# Patient Record
Sex: Female | Born: 1945
Health system: Southern US, Community
[De-identification: ages and names within clinical notes are randomized; demographics above are authoritative.]

## PROBLEM LIST (undated history)

## (undated) DIAGNOSIS — I10 Essential (primary) hypertension: Secondary | ICD-10-CM

## (undated) DIAGNOSIS — K219 Gastro-esophageal reflux disease without esophagitis: Secondary | ICD-10-CM

## (undated) DIAGNOSIS — I499 Cardiac arrhythmia, unspecified: Secondary | ICD-10-CM

## (undated) DIAGNOSIS — M1990 Unspecified osteoarthritis, unspecified site: Secondary | ICD-10-CM

## (undated) HISTORY — PX: THYROIDECTOMY, PARTIAL: SHX18

## (undated) HISTORY — PX: TONSILLECTOMY: SUR1361

## (undated) HISTORY — PX: BREAST LUMPECTOMY: SHX2

---

## 2014-04-03 ENCOUNTER — Encounter: Payer: Self-pay | Admitting: Sports Medicine

## 2014-04-03 ENCOUNTER — Ambulatory Visit
Admission: RE | Admit: 2014-04-03 | Discharge: 2014-04-03 | Disposition: A | Discharge status: Home / Self Care | Payer: Medicare Other | Source: Ambulatory Visit | Attending: Sports Medicine | Admitting: Sports Medicine

## 2014-04-03 ENCOUNTER — Ambulatory Visit (INDEPENDENT_AMBULATORY_CARE_PROVIDER_SITE_OTHER): Payer: Medicare Other | Admitting: Sports Medicine

## 2014-04-03 VITALS — BP 193/76 | Ht 65.0 in | Wt 197.0 lb

## 2014-04-03 DIAGNOSIS — M25511 Pain in right shoulder: Secondary | ICD-10-CM

## 2014-04-03 DIAGNOSIS — M25519 Pain in unspecified shoulder: Secondary | ICD-10-CM

## 2014-04-03 DIAGNOSIS — S42209A Unspecified fracture of upper end of unspecified humerus, initial encounter for closed fracture: Secondary | ICD-10-CM

## 2014-04-03 NOTE — Progress Notes (Signed)
   Subjective:    Patient ID: Rhonda Bell, female    DOB: September 06, 1946, 68 y.o.   MRN: [id number]  HPI chief complaint: Right shoulder pain  Very pleasant 68 year old right-hand-dominant female comes in today complaining of 3-1/2 months of right shoulder pain. She suffered a fall back at the end of February landing on her right shoulder. She suffered a proximal humerus fracture and was treated by an orthopedist in [facility]. Initial treatment consisted of prolonged sling immobilization of 10 weeks followed by physical therapy. She has continued to have persistent pain and limited mobility. Her pain is all along the lateral shoulder. Some radiating pain into her forearm. Also some occasional shooting pain into her forearm and wrist. She denies problems with this shoulder in the past.  Medical history reviewed. She has a history of hypertension Medications reviewed Nonsmoker     Review of Systems     Objective:   Physical Exam Well-developed, well-nourished. No acute distress. Awake alert oriented x3. Vital signs are reviewed.  Right shoulder: There is tenderness to palpation along the lateral shoulder. No soft tissue swelling. No ecchymosis. Active forward flexion is to 80. Active abduction is to 60. Passive rotation shows active forward flexion to about 110. Active abduction only to 70. Internal rotation is 70. Passive external rotation is nearly equal bilaterally. There is global weakness. Patient demonstrates full neurological function distally. Good radial and ulnar pulses.  X-rays from an outside source of the right shoulder are reviewed. They demonstrate a slightly displaced surgical neck fracture of the proximal humerus. I repeat x-rays today of both the humerus and the shoulder. This appears to be a two-part surgical neck fracture with mild displacement but evidence of possible nonunion.  MSK ultrasound of the right shoulder was performed. Limited images were obtained. It was  difficult to fully visualize the rotator cuff. I think this was due in part to her fracture as well as patient discomfort during the exam.      Assessment & Plan:  1. 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 Dion SaucierLandau for further workup and treatment. In the meantime, patient will continue with simple range of motion exercises using pain as her guide. I'll defer further workup and treatment to the discretion of Dr. Dion SaucierLandau and the patient will followup with me when necessary.

## 2014-04-03 NOTE — Patient Instructions (Signed)
[facility] AT [address] [facility] [address] [place] [place] [id number] [phone]

## 2015-04-16 ENCOUNTER — Emergency Department (HOSPITAL_BASED_OUTPATIENT_CLINIC_OR_DEPARTMENT_OTHER)
Admission: EM | Admit: 2015-04-16 | Discharge: 2015-04-16 | Disposition: A | Discharge status: Home / Self Care | Payer: Medicare Other | Attending: Emergency Medicine | Admitting: Emergency Medicine

## 2015-04-16 ENCOUNTER — Encounter (HOSPITAL_BASED_OUTPATIENT_CLINIC_OR_DEPARTMENT_OTHER): Payer: Self-pay

## 2015-04-16 DIAGNOSIS — Z7982 Long term (current) use of aspirin: Secondary | ICD-10-CM | POA: Diagnosis not present

## 2015-04-16 DIAGNOSIS — S61207A Unspecified open wound of left little finger without damage to nail, initial encounter: Secondary | ICD-10-CM | POA: Diagnosis not present

## 2015-04-16 DIAGNOSIS — S61217A Laceration without foreign body of left little finger without damage to nail, initial encounter: Secondary | ICD-10-CM | POA: Diagnosis present

## 2015-04-16 DIAGNOSIS — Z23 Encounter for immunization: Secondary | ICD-10-CM | POA: Insufficient documentation

## 2015-04-16 DIAGNOSIS — M1990 Unspecified osteoarthritis, unspecified site: Secondary | ICD-10-CM | POA: Insufficient documentation

## 2015-04-16 DIAGNOSIS — I10 Essential (primary) hypertension: Secondary | ICD-10-CM | POA: Insufficient documentation

## 2015-04-16 DIAGNOSIS — W274XXA Contact with kitchen utensil, initial encounter: Secondary | ICD-10-CM | POA: Insufficient documentation

## 2015-04-16 DIAGNOSIS — Y93G9 Activity, other involving cooking and grilling: Secondary | ICD-10-CM | POA: Diagnosis not present

## 2015-04-16 DIAGNOSIS — T148XXA Other injury of unspecified body region, initial encounter: Secondary | ICD-10-CM

## 2015-04-16 DIAGNOSIS — Y9289 Other specified places as the place of occurrence of the external cause: Secondary | ICD-10-CM | POA: Diagnosis not present

## 2015-04-16 DIAGNOSIS — Y998 Other external cause status: Secondary | ICD-10-CM | POA: Diagnosis not present

## 2015-04-16 DIAGNOSIS — Z79899 Other long term (current) drug therapy: Secondary | ICD-10-CM | POA: Diagnosis not present

## 2015-04-16 HISTORY — DX: Unspecified osteoarthritis, unspecified site: M19.90

## 2015-04-16 HISTORY — DX: Essential (primary) hypertension: I10

## 2015-04-16 MED ORDER — TETANUS-DIPHTH-ACELL PERTUSSIS 5-2.5-18.5 LF-MCG/0.5 IM SUSP
0.5000 mL | Freq: Once | INTRAMUSCULAR | Status: AC
Start: 2015-04-16 — End: 2015-04-16
  Administered 2015-04-16: 0.5 mL via INTRAMUSCULAR
  Filled 2015-04-16: qty 0.5

## 2015-04-16 MED ORDER — "THROMBI-PAD 3""X3"" EX PADS"
1.0000 | MEDICATED_PAD | Freq: Once | CUTANEOUS | Status: AC
  Administered 2015-04-16: 1 via TOPICAL

## 2015-04-16 MED ORDER — GELATIN ABSORBABLE 12-7 MM EX MISC
1.0000 | Freq: Once | CUTANEOUS | Status: AC
  Administered 2015-04-16: 1 via TOPICAL

## 2015-04-16 MED ORDER — GELATIN ABSORBABLE 12-7 MM EX MISC
CUTANEOUS | Status: AC
  Administered 2015-04-16: 1 via TOPICAL
  Filled 2015-04-16: qty 1

## 2015-04-16 MED ORDER — "THROMBI-PAD 3""X3"" EX PADS"
MEDICATED_PAD | CUTANEOUS | Status: AC
  Administered 2015-04-16: 1 via TOPICAL
  Filled 2015-04-16: qty 1

## 2015-04-16 NOTE — ED Provider Notes (Signed)
CSN: [id number]     Arrival date & time 04/16/15  1837 History   First MD Initiated Contact with Patient 04/16/15 1848     Chief Complaint  Patient presents with  . Laceration     (Consider location/radiation/quality/duration/timing/severity/associated sxs/prior Treatment) HPI Comments: Pt cut right fifth and fourth digit with a mandolin. Unable to control bleeding on the fifth digit  Patient is a 69 y.o. female presenting with skin laceration. The history is provided by the patient. No language interpreter was used.  Laceration Location:  Finger Finger laceration location:  L little finger Quality: avulsion   Bleeding: uncontrolled     Past Medical History  Diagnosis Date  . Hypertension   . Arthritis    History reviewed. No pertinent past surgical history. No family history on file. History  Substance Use Topics  . Smoking status: Never Smoker   . Smokeless tobacco: Not on file  . Alcohol Use: Not on file   OB History    No data available     Review of Systems  All other systems reviewed and are negative.     Allergies  Penicillins and Sulfa antibiotics  Home Medications   Prior to Admission medications   Medication Sig Start Date End Date Taking? Authorizing Provider  aspirin EC 81 MG tablet Take 81 mg by mouth daily.   Yes Historical Provider, MD  atenolol (TENORMIN) 50 MG tablet Take 50 mg by mouth daily.   Yes Historical Provider, MD  cetirizine (ZYRTEC) 10 MG tablet Take 10 mg by mouth daily.   Yes Historical Provider, MD  hydrochlorothiazide (MICROZIDE) 12.5 MG capsule Take 12.5 mg by mouth daily.   Yes Historical Provider, MD  losartan (COZAAR) 100 MG tablet Take 100 mg by mouth daily.   Yes Historical Provider, MD  omeprazole (PRILOSEC) 20 MG capsule Take 20 mg by mouth daily.   Yes Historical Provider, MD   BP 189/73 mmHg  Pulse 55  Temp(Src) 98.3 F (36.8 C) (Oral)  Resp 18  SpO2 100% Physical Exam  Constitutional: She is oriented to person,  place, and time. She appears well-developed and well-nourished.  Cardiovascular: Normal rate and regular rhythm.   Pulmonary/Chest: Effort normal and breath sounds normal.  Neurological: She is alert and oriented to person, place, and time.  Skin:  Skin avulsion noted to the right fifth digit. Bleeding. Small area noted to the 4th digit. Not bleeding. Skin to the area  Nursing note and vitals reviewed.   ED Course  Procedures (including critical care time) Labs Review Labs Reviewed - No data to display  Imaging Review No results found.   EKG Interpretation None      MDM   Final diagnoses:  Skin avulsion    Gel foam and thrombi pad used to the area and is okay to go home. Discussed return precautions    Teressa LowerVrinda Kyre Jeffries, NP 04/16/15 1947  Blake DivineJohn Wofford, MD 04/16/15 2328

## 2015-04-16 NOTE — Discharge Instructions (Signed)
Follow up as needed for any sign of infection Laceration Care, Adult A laceration is a cut or lesion that goes through all layers of the skin and into the tissue just beneath the skin. TREATMENT  Some lacerations may not require closure. Some lacerations may not be able to be closed due to an increased risk of infection. It is important to see your caregiver as soon as possible after an injury to minimize the risk of infection and maximize the opportunity for successful closure. If closure is appropriate, pain medicines may be given, if needed. The wound will be cleaned to help prevent infection. Your caregiver will use stitches (sutures), staples, wound glue (adhesive), or skin adhesive strips to repair the laceration. These tools bring the skin edges together to allow for faster healing and a better cosmetic outcome. However, all wounds will heal with a scar. Once the wound has healed, scarring can be minimized by covering the wound with sunscreen during the day for 1 full year. HOME CARE INSTRUCTIONS  For sutures or staples:  Keep the wound clean and dry.  If you were given a bandage (dressing), you should change it at least once a day. Also, change the dressing if it becomes wet or dirty, or as directed by your caregiver.  Wash the wound with soap and water 2 times a day. Rinse the wound off with water to remove all soap. Pat the wound dry with a clean towel.  After cleaning, apply a thin layer of the antibiotic ointment as recommended by your caregiver. This will help prevent infection and keep the dressing from sticking.  You may shower as usual after the first 24 hours. Do not soak the wound in water until the sutures are removed.  Only take over-the-counter or prescription medicines for pain, discomfort, or fever as directed by your caregiver.  Get your sutures or staples removed as directed by your caregiver. For skin adhesive strips:  Keep the wound clean and dry.  Do not get the  skin adhesive strips wet. You may bathe carefully, using caution to keep the wound dry.  If the wound gets wet, pat it dry with a clean towel.  Skin adhesive strips will fall off on their own. You may trim the strips as the wound heals. Do not remove skin adhesive strips that are still stuck to the wound. They will fall off in time. For wound adhesive:  You may briefly wet your wound in the shower or bath. Do not soak or scrub the wound. Do not swim. Avoid periods of heavy perspiration until the skin adhesive has fallen off on its own. After showering or bathing, gently pat the wound dry with a clean towel.  Do not apply liquid medicine, cream medicine, or ointment medicine to your wound while the skin adhesive is in place. This may loosen the film before your wound is healed.  If a dressing is placed over the wound, be careful not to apply tape directly over the skin adhesive. This may cause the adhesive to be pulled off before the wound is healed.  Avoid prolonged exposure to sunlight or tanning lamps while the skin adhesive is in place. Exposure to ultraviolet light in the first year will darken the scar.  The skin adhesive will usually remain in place for 5 to 10 days, then naturally fall off the skin. Do not pick at the adhesive film. You may need a tetanus shot if:  You cannot remember when you had your last  tetanus shot.  You have never had a tetanus shot. If you get a tetanus shot, your arm may swell, get red, and feel warm to the touch. This is common and not a problem. If you need a tetanus shot and you choose not to have one, there is a rare chance of getting tetanus. Sickness from tetanus can be serious. SEEK MEDICAL CARE IF:   You have redness, swelling, or increasing pain in the wound.  You see a red line that goes away from the wound.  You have yellowish-white fluid (pus) coming from the wound.  You have a fever.  You notice a bad smell coming from the wound or  dressing.  Your wound breaks open before or after sutures have been removed.  You notice something coming out of the wound such as wood or glass.  Your wound is on your hand or foot and you cannot move a finger or toe. SEEK IMMEDIATE MEDICAL CARE IF:   Your pain is not controlled with prescribed medicine.  You have severe swelling around the wound causing pain and numbness or a change in color in your arm, hand, leg, or foot.  Your wound splits open and starts bleeding.  You have worsening numbness, weakness, or loss of function of any joint around or beyond the wound.  You develop painful lumps near the wound or on the skin anywhere on your body. MAKE SURE YOU:   Understand these instructions.  Will watch your condition.  Will get help right away if you are not doing well or get worse. Document Released: 10/05/2005 Document Revised: 12/28/2011 Document Reviewed: 03/31/2011 [facility] Patient Information 2015 [place], [place]. This information is not intended to replace advice given to you by your health care provider. Make sure you discuss any questions you have with your health care provider.

## 2015-04-16 NOTE — ED Notes (Signed)
Patient here with laceration to right hand 4th and 5th digits, cut same with slicer this pm, pressure dressing applied

## 2016-11-17 ENCOUNTER — Ambulatory Visit (INDEPENDENT_AMBULATORY_CARE_PROVIDER_SITE_OTHER): Payer: PPO | Admitting: Sports Medicine

## 2016-11-17 DIAGNOSIS — M25562 Pain in left knee: Secondary | ICD-10-CM | POA: Diagnosis not present

## 2016-11-17 NOTE — Assessment & Plan Note (Signed)
More consistent with meniscal tear given pain with pivoting and descending stairs. No obvious effusion or tear and quick bedside US today. Discussed treatment options with patient. Given that it is improving, will continue with conservative management. Placed patient in compression sleeve and discussed isometric quad strengthening exercises. Follow up in 1 month. If not improving, would consider steroid injection at that time.

## 2016-11-17 NOTE — Progress Notes (Signed)
    Subjective:  Rhonda Bell is a [age] y.o. female who [facility] with a chief complaint of left knee pain.   HPI:  Left Knee Pain Symptoms started about 2 months ago when she was moving furniture around a warehouse. She noticed pain and swelling that night after moving furniture all day. She went back the next day and did more moving and the pain worsened, so she stopped. She has not done have strenuous exercises since then. Overall, her pain has significantly improved over the past several weeks. She has not noticed any swelling since the initial injury. Pain is worse with squatting, going down inclines, and pivoting. No mechanical symptoms. She is on meloxicam chronically. She tried taking some tylenol which helped with her pain.   ROS: Per HPI  Objective:  Physical Exam: BP (!) 143/76   Ht 5\' 5"  (1.651 m)   Wt 185 lb (83.9 kg)   BMI 30.79 kg/m   Gen: NAD, resting comfortably MSK: - L Knee: No deformities. Tender to palpation over medial joint line. Moderate crepitus with active ROM. FROM. Strength 55/ in all directions. Stable to varus and valgus stress. Anterior and posterior drawer negative. Lachman negative. Pain with thessaly's.  Bedside US L Knee: No effusion in suprapatellar pouch. Medial meniscus visualized without any obvious tears.   Assessment/Plan:  Left knee pain More consistent with meniscal tear given pain with pivoting and descending stairs. No obvious effusion or tear and quick bedside US today. Discussed treatment options with patient. Given that it is improving, will continue with conservative management. Placed patient in compression sleeve and discussed isometric quad strengthening exercises. Follow up in 1 month. If not improving, would consider steroid injection at that time.    Katina Degreealeb M. Jimmey RalphParker, MD [facility] Resident PGY-3 11/17/2016 11:42 AM   Patient seen and evaluated with the resident. I agree with the above plan of care.  Patient's history and physical exam suggest a degenerative meniscal tear. Since she is already improving we will try a short course of compression and isometric quad strengthening exercises. [place] for [place] to play in a pickle ball determine in March so I'll have her follow-up with me again at the end of February for reevaluation. We did discuss the merits of a cortisone injection if her symptoms do not continue to improve. She will call with questions or concerns prior to her follow-up visit.

## 2016-12-15 ENCOUNTER — Ambulatory Visit (INDEPENDENT_AMBULATORY_CARE_PROVIDER_SITE_OTHER): Payer: PPO | Admitting: Sports Medicine

## 2016-12-15 ENCOUNTER — Encounter: Payer: Self-pay | Admitting: Sports Medicine

## 2016-12-15 VITALS — BP 157/90 | Ht 65.0 in | Wt 185.0 lb

## 2016-12-15 DIAGNOSIS — G5691 Unspecified mononeuropathy of right upper limb: Secondary | ICD-10-CM

## 2016-12-15 DIAGNOSIS — M25562 Pain in left knee: Secondary | ICD-10-CM

## 2016-12-16 NOTE — Progress Notes (Signed)
   Subjective:    Patient ID: Rhonda CooleyJeanne Brass, female    DOB: 1946/01/22, 71 y.o.   MRN: [id number]  HPI   Patient comes in today for follow-up on left knee pain. Overall her symptoms have improved. She still has some mild discomfort and some mild stiffness but it is better than a month ago. She will be leaving soon for a trip to [place]. She is also complaining of 2 weeks of intermittent right shoulder pain. Pain is primarily around the scapula but she will get radiating pain into the axilla as well as down the ulnar aspect of her right arm. Her symptoms are worse with driving or with elevating her shoulders. She has not noticed any weakness. She does note that if she sits with her arms directly at her side then her symptoms do improve.    Review of Systems    as above Objective:   Physical Exam  Well-developed, well-nourished. No acute distress. Vital signs reviewed  Left knee: Full range of motion. No obvious effusion. 2+ patellofemoral crepitus. Mild tenderness to palpation along the medial joint line. Negative McMurray's. Good joint stability. Neurovascularly intact distally.  Left shoulder: Full painless range of motion. Good cuff strength.  Neurological exam: Positive Spurling's to the right. Strength is 5/5 in both upper extremities. Reflexes are equal at the biceps, triceps, and brachial radialis tendons. Sensation is intact to light touch grossly. No obvious atrophy.      Assessment & Plan:   Improving left knee pain likely secondary to degenerative meniscal tear Right shoulder and arm pain likely secondary to cervical degenerative disc disease  Since the patient's left knee pain has improved we are going to hold on further workup or treatment for the time being. She will be going to [place] soon and will be participating in pickle ball. That will be a good test for her knee. If her symptoms worsen then she will return to the office for reconsideration of a cortisone  injection. In regards to her neuropathic right arm pain, it sounds like she has a modified traction device at home for her cervical spine. She does think that this helps with her symptoms so I've encouraged her to continue using it. She will try to avoid positions such as driving with her arm up on the steering wheel or sitting with her arms elevated on a high desk which would close down the cervical spine foramen on the right side. If her symptoms become more frequent or more intolerable then we will need to get some imaging of her cervical spine. Follow-up with me as needed.

## 2017-06-08 DIAGNOSIS — R928 Other abnormal and inconclusive findings on diagnostic imaging of breast: Secondary | ICD-10-CM | POA: Diagnosis not present

## 2017-06-08 DIAGNOSIS — N6489 Other specified disorders of breast: Secondary | ICD-10-CM | POA: Diagnosis not present

## 2017-06-08 DIAGNOSIS — N6032 Fibrosclerosis of left breast: Secondary | ICD-10-CM | POA: Diagnosis not present

## 2017-06-08 DIAGNOSIS — N6320 Unspecified lump in the left breast, unspecified quadrant: Secondary | ICD-10-CM | POA: Diagnosis not present

## 2017-06-25 DIAGNOSIS — I10 Essential (primary) hypertension: Secondary | ICD-10-CM | POA: Diagnosis not present

## 2017-06-25 DIAGNOSIS — R739 Hyperglycemia, unspecified: Secondary | ICD-10-CM | POA: Diagnosis not present

## 2017-06-28 DIAGNOSIS — Z0000 Encounter for general adult medical examination without abnormal findings: Secondary | ICD-10-CM | POA: Diagnosis not present

## 2017-06-28 DIAGNOSIS — Z23 Encounter for immunization: Secondary | ICD-10-CM | POA: Diagnosis not present

## 2017-06-28 DIAGNOSIS — I10 Essential (primary) hypertension: Secondary | ICD-10-CM | POA: Diagnosis not present

## 2017-06-28 DIAGNOSIS — E782 Mixed hyperlipidemia: Secondary | ICD-10-CM | POA: Diagnosis not present

## 2017-06-28 DIAGNOSIS — K219 Gastro-esophageal reflux disease without esophagitis: Secondary | ICD-10-CM | POA: Diagnosis not present

## 2017-06-28 DIAGNOSIS — N200 Calculus of kidney: Secondary | ICD-10-CM | POA: Diagnosis not present

## 2017-07-02 DIAGNOSIS — Z1211 Encounter for screening for malignant neoplasm of colon: Secondary | ICD-10-CM | POA: Diagnosis not present

## 2017-07-15 DIAGNOSIS — Z1211 Encounter for screening for malignant neoplasm of colon: Secondary | ICD-10-CM | POA: Diagnosis not present

## 2017-07-15 DIAGNOSIS — R195 Other fecal abnormalities: Secondary | ICD-10-CM | POA: Diagnosis not present

## 2018-02-08 ENCOUNTER — Ambulatory Visit
Admission: RE | Admit: 2018-02-08 | Discharge: 2018-02-08 | Disposition: A | Discharge status: Home / Self Care | Payer: PPO | Source: Ambulatory Visit | Attending: Sports Medicine | Admitting: Sports Medicine

## 2018-02-08 ENCOUNTER — Ambulatory Visit: Payer: PPO | Admitting: Sports Medicine

## 2018-02-08 VITALS — BP 124/72 | Ht 65.0 in | Wt 192.0 lb

## 2018-02-08 DIAGNOSIS — G8929 Other chronic pain: Secondary | ICD-10-CM | POA: Diagnosis not present

## 2018-02-08 DIAGNOSIS — M1712 Unilateral primary osteoarthritis, left knee: Secondary | ICD-10-CM | POA: Diagnosis not present

## 2018-02-08 DIAGNOSIS — M25562 Pain in left knee: Principal | ICD-10-CM

## 2018-02-09 NOTE — Progress Notes (Signed)
   Subjective:    Patient ID: Rhonda Bell, female    DOB: 18-Apr-1946, 72 y.o.   MRN: [id number]  HPI chief complaint: Left knee pain  Patient comes in today with persistent left knee pain. She was seen in the office a little over a year ago. She was given a compression sleeve and some home exercises and her symptoms did improve a little but never completely resolved. She endorses a sharp and catching pain along the medial knee which happens intermittently. She has noticed some slight swelling. She does get some feelings of instability. No recent trauma. No numbness or tingling. For the most part, her symptoms are tolerable.   Review of Systems As above    Objective:   Physical Exam  Well-developed, well-nourished. No acute distress. Awake alert and oriented 3. Vital signs reviewed  Left knee: Full range of motion.trace effusion. She is tender to palpation along the medial joint line but has a negative Thessaly's. Knee is grossly stable to ligamentous exam. 1+ patellofemoral crepitus. Neurovascularly intact distally.  X-rays of the left knee show a moderate amount of medial joint space narrowing consistent with medial compartmental DJD. Nothing acute is seen.      Assessment & Plan:   Left knee pain secondary to medial compartmental DJD versus degenerative meniscal tear  Patient's symptoms are not bothering her enough at this point in time to pursue further treatment. She takes meloxicam daily which is helpful. If her symptoms do worsen I would recommend starting with a cortisone injection before considering further diagnostic imaging. Patient was re-educated in isometric quad exercises and will follow-up with me as needed.

## 2018-03-30 DIAGNOSIS — I10 Essential (primary) hypertension: Secondary | ICD-10-CM | POA: Diagnosis not present

## 2018-03-30 DIAGNOSIS — R221 Localized swelling, mass and lump, neck: Secondary | ICD-10-CM | POA: Diagnosis not present

## 2018-03-30 DIAGNOSIS — E782 Mixed hyperlipidemia: Secondary | ICD-10-CM | POA: Diagnosis not present

## 2018-04-06 DIAGNOSIS — R221 Localized swelling, mass and lump, neck: Secondary | ICD-10-CM | POA: Diagnosis not present

## 2018-05-16 ENCOUNTER — Telehealth: Payer: Self-pay | Admitting: Sports Medicine

## 2018-05-16 NOTE — Telephone Encounter (Signed)
Patient wants to know if there is anything else that can be done for her L. knee at this point.  It is still giving her problems.  What are her options now?  She wants to know if surgery (other than knee replacement) would help? Maybe an Exogen disc?

## 2018-06-03 ENCOUNTER — Encounter

## 2018-06-03 ENCOUNTER — Encounter: Payer: Self-pay | Admitting: Sports Medicine

## 2018-06-03 ENCOUNTER — Ambulatory Visit: Payer: PPO | Admitting: Sports Medicine

## 2018-06-03 VITALS — BP 142/88 | Ht 65.0 in | Wt 194.0 lb

## 2018-06-03 DIAGNOSIS — M1712 Unilateral primary osteoarthritis, left knee: Secondary | ICD-10-CM | POA: Diagnosis not present

## 2018-06-03 MED ORDER — METHYLPREDNISOLONE ACETATE 40 MG/ML IJ SUSP
40.0000 mg | Freq: Once | INTRAMUSCULAR | Status: AC
  Administered 2018-06-03: 40 mg via INTRA_ARTICULAR

## 2018-06-03 NOTE — Progress Notes (Signed)
   Subjective:    Patient ID: Rhonda Bell, female    DOB: July 24, 1946, 72 y.o.   MRN: [id number]  HPI   Patient comes in today with persistent left knee pain and swelling. Previous x-rays showed moderate medial compartmental DJD. Her pain is most noticeable with walking down a steep hill. Mild pain when coming down stairs. No locking. She wears her compression sleeve when active. She is interested in trying a cortisone injection.   Review of Systems    as above Objective:   Physical Exam  Well-developed, well-nourished. No acute distress. Awake alert and oriented 3. Vital signs reviewed  Left knee: Range of motion is 0-130. Trace effusion. She is tender to palpation along the medial joint line. No tenderness along the lateral joint line. Negative McMurray's. Good ligament stability. Neurovascularly intact distally. Walking without significant limp.  Previous x-rays show moderate medial compartment DJD      Assessment & Plan:   Persistent left knee pain secondary to DJD versus meniscal tear  Patient's left knee is injected today with cortisone. An anterior lateral approach was utilized. Patient tolerated this without difficulty. She may resume activity as tolerated. If symptoms persist, she will call the office and we will consider further diagnostic imaging to rule out a meniscal tear. Follow-up for ongoing or recalcitrant issues.  Consent obtained and verified. Time-out conducted. Noted no overlying erythema, induration, or other signs of local infection. Skin prepped in a sterile fashion. Topical analgesic spray: Ethyl chloride. Joint: left knee Needle: 25g 1.5 inch Completed without difficulty. Meds: 3cc 1% xylocaine, 1cc (40mg ) depomedrol  Advised to call if fevers/chills, erythema, induration, drainage, or persistent bleeding.

## 2018-06-14 DIAGNOSIS — Z09 Encounter for follow-up examination after completed treatment for conditions other than malignant neoplasm: Secondary | ICD-10-CM | POA: Diagnosis not present

## 2018-06-14 DIAGNOSIS — R928 Other abnormal and inconclusive findings on diagnostic imaging of breast: Secondary | ICD-10-CM | POA: Diagnosis not present

## 2018-06-14 DIAGNOSIS — N6322 Unspecified lump in the left breast, upper inner quadrant: Secondary | ICD-10-CM | POA: Diagnosis not present

## 2018-07-01 DIAGNOSIS — I10 Essential (primary) hypertension: Secondary | ICD-10-CM | POA: Diagnosis not present

## 2018-07-01 DIAGNOSIS — R739 Hyperglycemia, unspecified: Secondary | ICD-10-CM | POA: Diagnosis not present

## 2018-07-04 DIAGNOSIS — Z0000 Encounter for general adult medical examination without abnormal findings: Secondary | ICD-10-CM | POA: Diagnosis not present

## 2018-07-04 DIAGNOSIS — K219 Gastro-esophageal reflux disease without esophagitis: Secondary | ICD-10-CM | POA: Diagnosis not present

## 2018-07-04 DIAGNOSIS — R739 Hyperglycemia, unspecified: Secondary | ICD-10-CM | POA: Diagnosis not present

## 2018-07-04 DIAGNOSIS — G43909 Migraine, unspecified, not intractable, without status migrainosus: Secondary | ICD-10-CM | POA: Diagnosis not present

## 2018-07-04 DIAGNOSIS — I10 Essential (primary) hypertension: Secondary | ICD-10-CM | POA: Diagnosis not present

## 2018-07-04 DIAGNOSIS — M159 Polyosteoarthritis, unspecified: Secondary | ICD-10-CM | POA: Diagnosis not present

## 2018-07-04 DIAGNOSIS — E782 Mixed hyperlipidemia: Secondary | ICD-10-CM | POA: Diagnosis not present

## 2018-07-15 DIAGNOSIS — Z23 Encounter for immunization: Secondary | ICD-10-CM | POA: Diagnosis not present

## 2018-07-26 DIAGNOSIS — R350 Frequency of micturition: Secondary | ICD-10-CM | POA: Diagnosis not present

## 2018-07-26 DIAGNOSIS — N3000 Acute cystitis without hematuria: Secondary | ICD-10-CM | POA: Diagnosis not present

## 2018-11-15 ENCOUNTER — Ambulatory Visit (INDEPENDENT_AMBULATORY_CARE_PROVIDER_SITE_OTHER): Payer: PPO | Admitting: Sports Medicine

## 2018-11-15 VITALS — BP 118/68 | Ht 65.0 in | Wt 190.0 lb

## 2018-11-15 DIAGNOSIS — M1712 Unilateral primary osteoarthritis, left knee: Secondary | ICD-10-CM | POA: Diagnosis not present

## 2018-11-15 MED ORDER — METHYLPREDNISOLONE ACETATE 40 MG/ML IJ SUSP
40.0000 mg | Freq: Once | INTRAMUSCULAR | Status: AC
  Administered 2018-11-15: 40 mg via INTRA_ARTICULAR

## 2018-11-16 NOTE — Progress Notes (Signed)
   Subjective:    Patient ID: Rhonda Bell, female    DOB: Sep 16, 1946, 73 y.o.   MRN: [id number]  HPI chief complaint: Left knee pain  Patient comes in today requesting repeat cortisone injection for her left knee.  Previous x-rays showed a moderate medial compartmental DJD.  Cortisone injection administered in August was very helpful.  Pain has started to return and she has a couple of trips planned.  Pain is identical in nature to what she has experienced previously.  She denies any trauma.  No swelling.  No locking, catching, or popping.  She does endorse some stiffness.  Pain is diffuse throughout the knee and she describes it is burning in quality. It is especially noticeable with going up and down stairs.  Interim medical history reviewed Medications reviewed Allergies reviewed   Review of Systems    As above Objective:   Physical Exam  Well-developed, well-nourished.  No acute distress.  Awake alert and oriented x3.  Vital signs reviewed.  Left knee: Full range of motion.  No obvious effusion.  She is tender to palpation along the lateral joint line but no tenderness along the medial joint line today.  Pain but no popping with McMurray's.  Knee is stable to ligamentous exam.  Neurovascularly intact distally.      Assessment & Plan:   Returning left knee pain secondary to DJD versus meniscal tear  Left knee is reinjected today with cortisone.  An anterior lateral approach was utilized.  Patient tolerates this without difficulty.  She will start isometric quad exercises daily.  If symptoms persist then consider merits of further diagnostic imaging.  Follow-up for ongoing or recalcitrant issues.  Consent obtained and verified. Time-out conducted. Noted no overlying erythema, induration, or other signs of local infection. Skin prepped in a sterile fashion. Topical analgesic spray: Ethyl chloride. Joint: left knee Needle: 25g 1.5 inch Completed without difficulty. Meds: 3cc  1% xylocaine, 1cc (40mg ) depomedrol  Advised to call if fevers/chills, erythema, induration, drainage, or persistent bleeding.

## 2019-03-16 DIAGNOSIS — R928 Other abnormal and inconclusive findings on diagnostic imaging of breast: Secondary | ICD-10-CM | POA: Diagnosis not present

## 2019-03-16 DIAGNOSIS — N6321 Unspecified lump in the left breast, upper outer quadrant: Secondary | ICD-10-CM | POA: Diagnosis not present

## 2019-03-16 DIAGNOSIS — Z780 Asymptomatic menopausal state: Secondary | ICD-10-CM | POA: Diagnosis not present

## 2019-03-16 DIAGNOSIS — M810 Age-related osteoporosis without current pathological fracture: Secondary | ICD-10-CM | POA: Diagnosis not present

## 2019-07-04 DIAGNOSIS — Z0000 Encounter for general adult medical examination without abnormal findings: Secondary | ICD-10-CM | POA: Diagnosis not present

## 2019-07-04 DIAGNOSIS — E782 Mixed hyperlipidemia: Secondary | ICD-10-CM | POA: Diagnosis not present

## 2019-07-04 DIAGNOSIS — K219 Gastro-esophageal reflux disease without esophagitis: Secondary | ICD-10-CM | POA: Diagnosis not present

## 2019-07-04 DIAGNOSIS — R739 Hyperglycemia, unspecified: Secondary | ICD-10-CM | POA: Diagnosis not present

## 2019-07-04 DIAGNOSIS — Z13220 Encounter for screening for lipoid disorders: Secondary | ICD-10-CM | POA: Diagnosis not present

## 2019-07-04 DIAGNOSIS — I10 Essential (primary) hypertension: Secondary | ICD-10-CM | POA: Diagnosis not present

## 2019-07-06 DIAGNOSIS — K219 Gastro-esophageal reflux disease without esophagitis: Secondary | ICD-10-CM | POA: Diagnosis not present

## 2019-07-06 DIAGNOSIS — E782 Mixed hyperlipidemia: Secondary | ICD-10-CM | POA: Diagnosis not present

## 2019-07-06 DIAGNOSIS — Z0000 Encounter for general adult medical examination without abnormal findings: Secondary | ICD-10-CM | POA: Diagnosis not present

## 2019-07-06 DIAGNOSIS — R739 Hyperglycemia, unspecified: Secondary | ICD-10-CM | POA: Diagnosis not present

## 2019-07-06 DIAGNOSIS — Z23 Encounter for immunization: Secondary | ICD-10-CM | POA: Diagnosis not present

## 2019-07-06 DIAGNOSIS — I10 Essential (primary) hypertension: Secondary | ICD-10-CM | POA: Diagnosis not present

## 2019-08-18 DIAGNOSIS — H43813 Vitreous degeneration, bilateral: Secondary | ICD-10-CM | POA: Diagnosis not present

## 2019-08-18 DIAGNOSIS — H2513 Age-related nuclear cataract, bilateral: Secondary | ICD-10-CM | POA: Diagnosis not present

## 2020-01-13 IMAGING — DX DG KNEE COMPLETE 4+V*L*
4 series · 4 of 4 positions shown · non-contrast
Comparison: None

CLINICAL DATA: LEFT knee worsening pain and swelling, injury 1 year
ago, history of arthritis and hypertension

EXAM:
LEFT KNEE - COMPLETE 4+ VIEW

[dg knee complete 4 views left (1 of 4)]
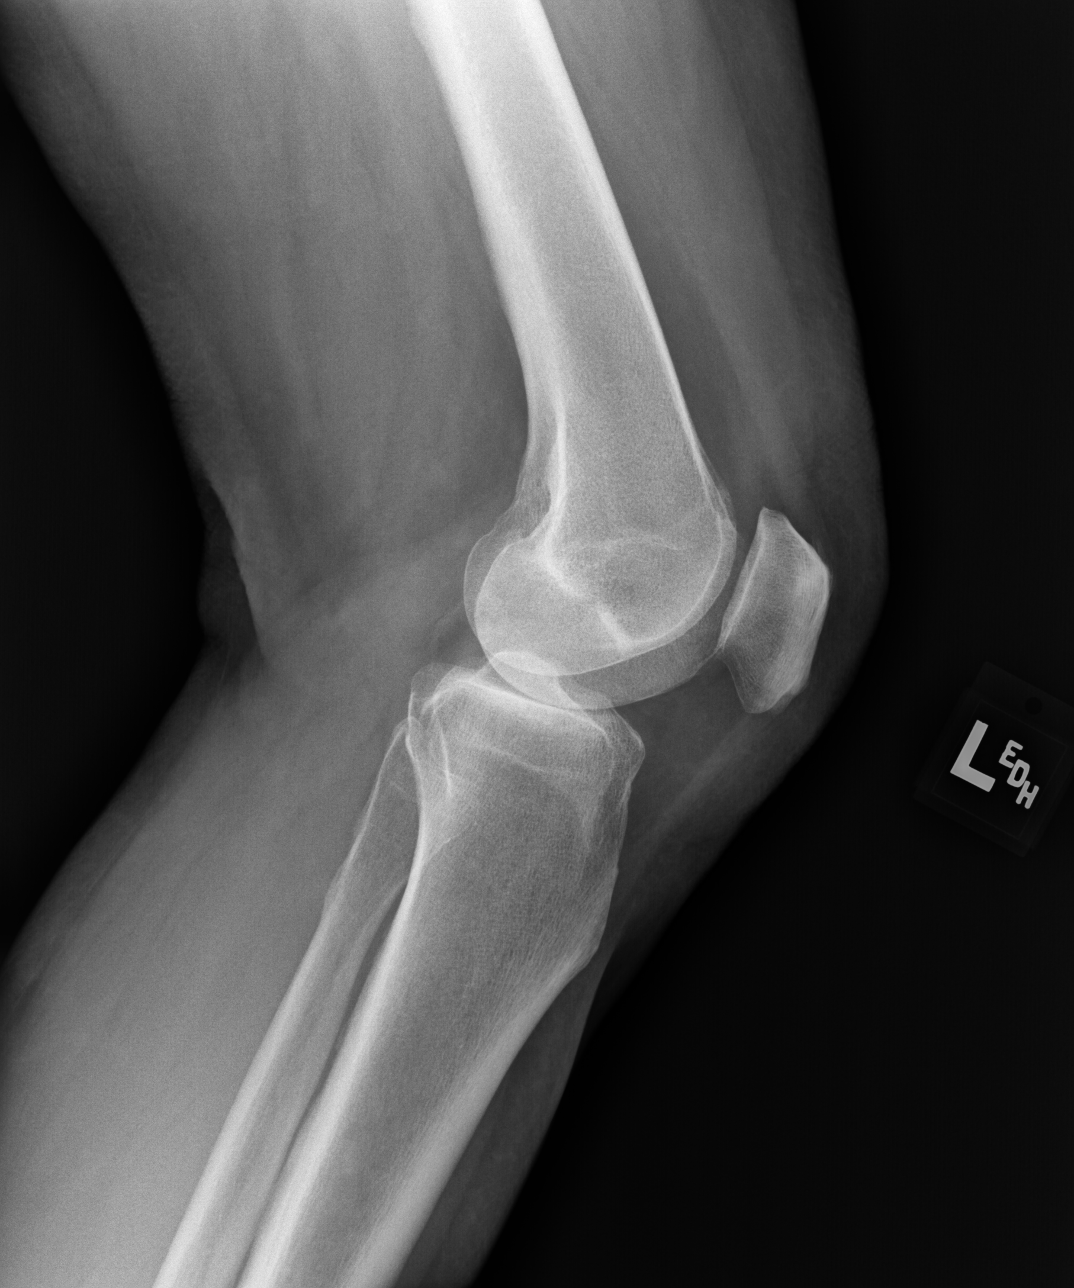

[dg knee complete 4 views left (2 of 4)]
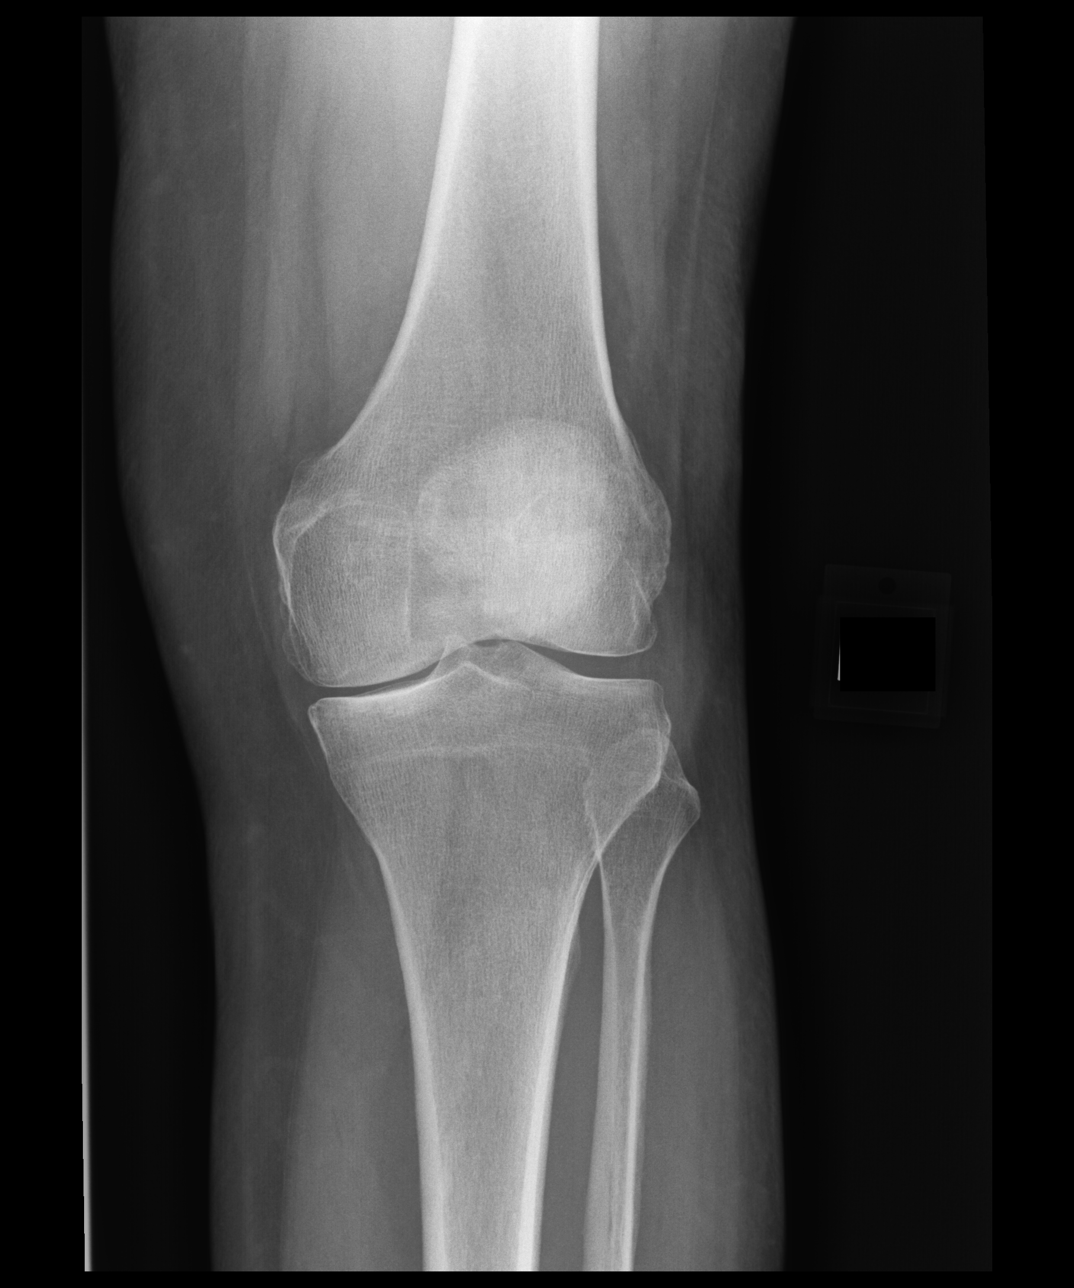

[dg knee complete 4 views left (3 of 4)]
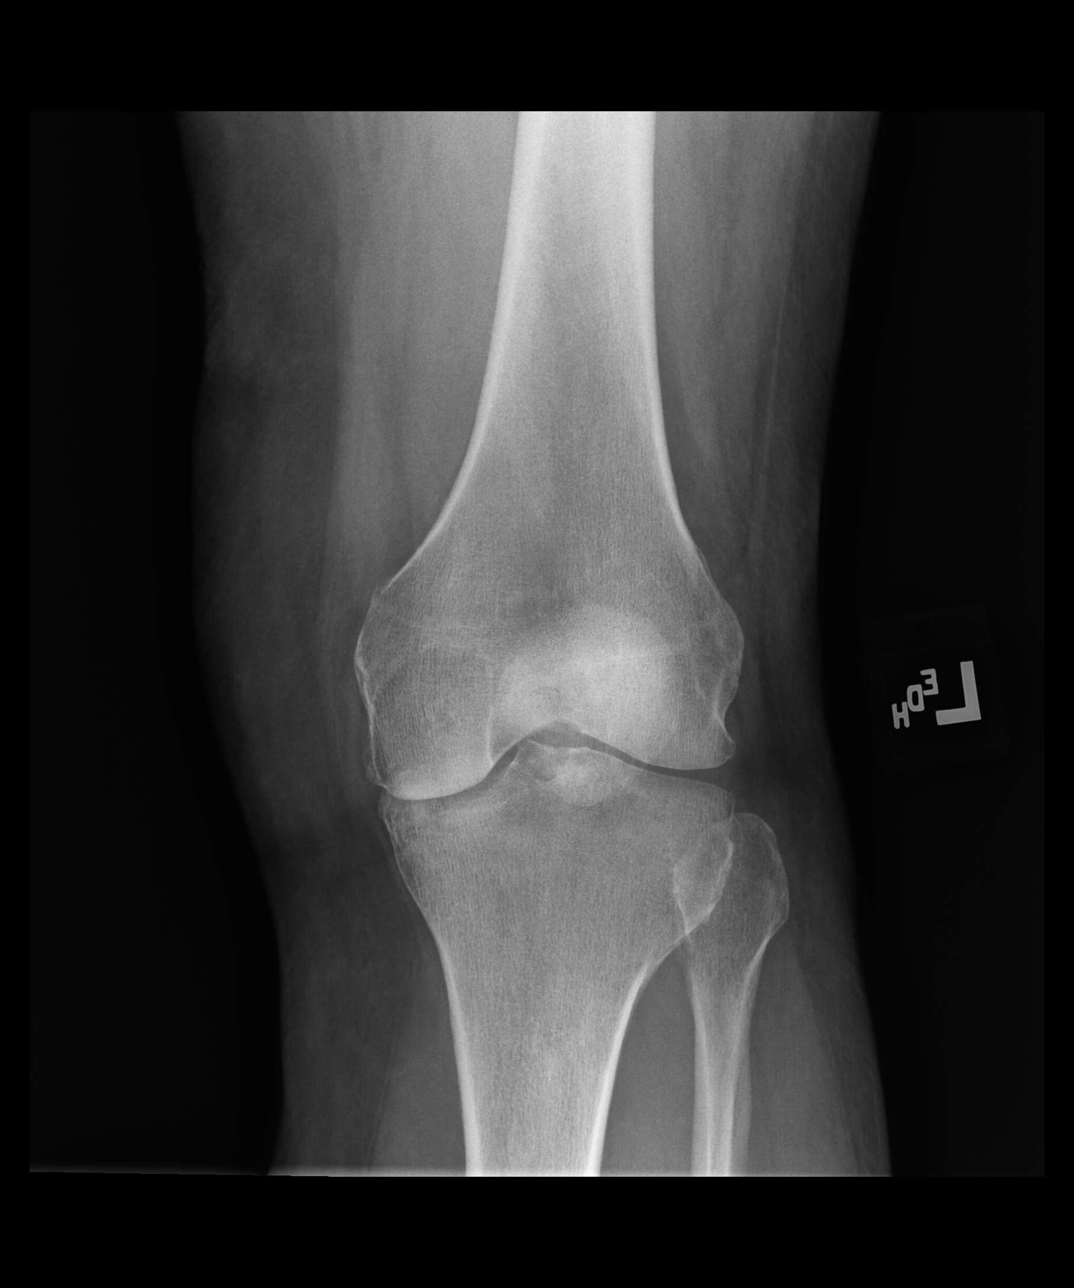

[dg knee complete 4 views left (4 of 4)]
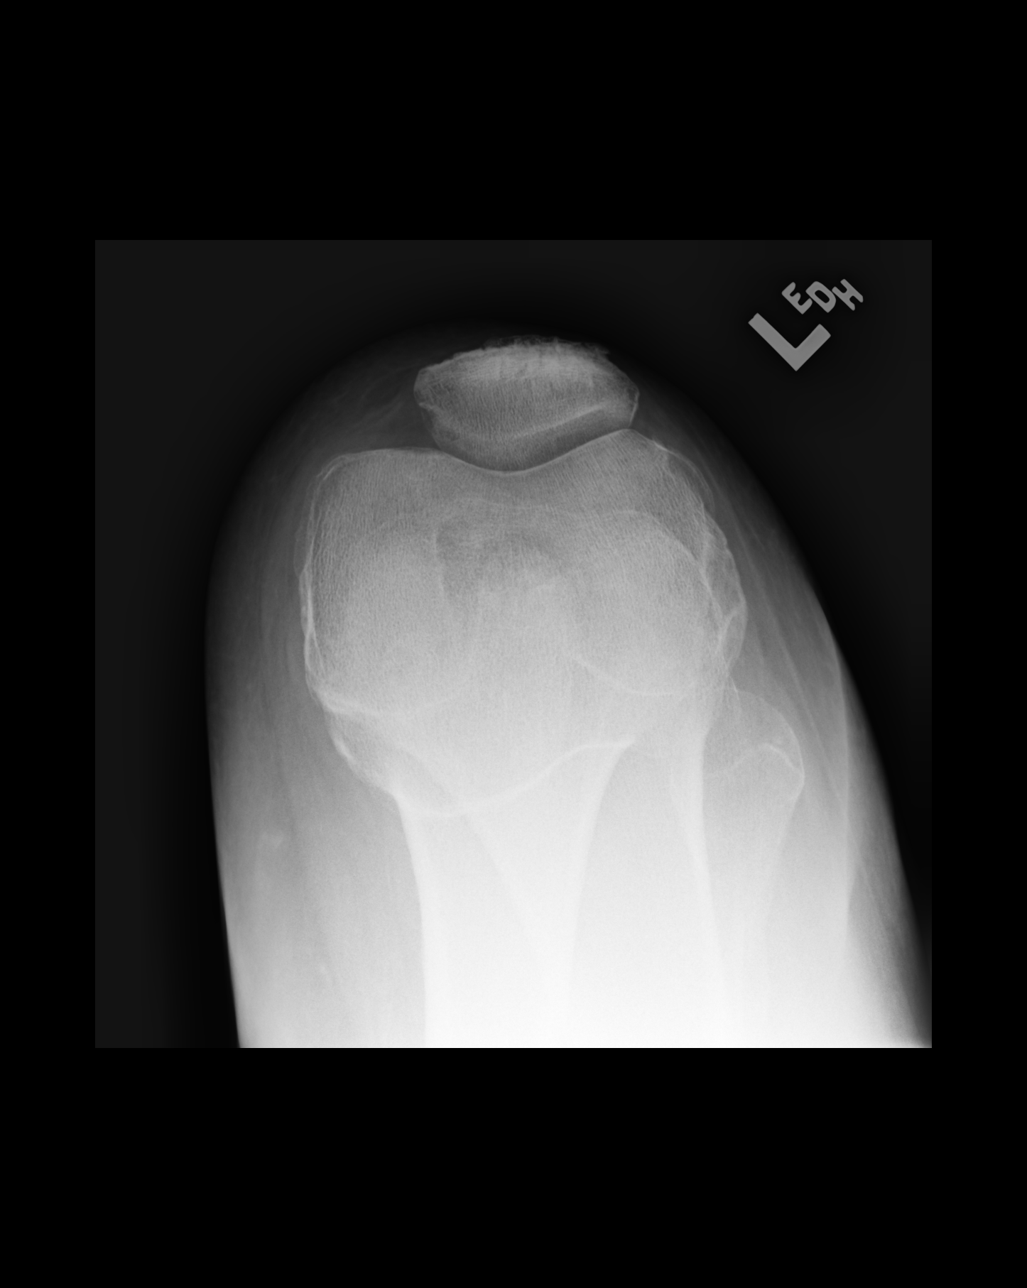

[4 of 4 positions shown; findings below may reference images not displayed]

FINDINGS: Osseous demineralization.

Joint space narrowing greatest at medial compartment.

No acute fracture, dislocation or bone destruction.

No knee joint effusion.
IMPRESSION: Degenerative changes LEFT knee greatest at medial compartment.

## 2020-08-19 DIAGNOSIS — E782 Mixed hyperlipidemia: Secondary | ICD-10-CM | POA: Diagnosis not present

## 2020-08-19 DIAGNOSIS — Z7689 Persons encountering health services in other specified circumstances: Secondary | ICD-10-CM | POA: Diagnosis not present

## 2020-08-19 DIAGNOSIS — Z79899 Other long term (current) drug therapy: Secondary | ICD-10-CM | POA: Diagnosis not present

## 2020-08-19 DIAGNOSIS — I10 Essential (primary) hypertension: Secondary | ICD-10-CM | POA: Diagnosis not present

## 2020-08-19 DIAGNOSIS — E876 Hypokalemia: Secondary | ICD-10-CM | POA: Diagnosis not present

## 2020-08-19 DIAGNOSIS — Z0000 Encounter for general adult medical examination without abnormal findings: Secondary | ICD-10-CM | POA: Diagnosis not present

## 2020-08-19 DIAGNOSIS — Z1231 Encounter for screening mammogram for malignant neoplasm of breast: Secondary | ICD-10-CM | POA: Diagnosis not present

## 2020-08-19 DIAGNOSIS — K219 Gastro-esophageal reflux disease without esophagitis: Secondary | ICD-10-CM | POA: Diagnosis not present

## 2020-08-19 DIAGNOSIS — Z23 Encounter for immunization: Secondary | ICD-10-CM | POA: Diagnosis not present

## 2020-08-19 DIAGNOSIS — M159 Polyosteoarthritis, unspecified: Secondary | ICD-10-CM | POA: Diagnosis not present

## 2020-08-19 DIAGNOSIS — G43909 Migraine, unspecified, not intractable, without status migrainosus: Secondary | ICD-10-CM | POA: Diagnosis not present

## 2020-09-04 DIAGNOSIS — Z1231 Encounter for screening mammogram for malignant neoplasm of breast: Secondary | ICD-10-CM | POA: Diagnosis not present

## 2021-05-12 DIAGNOSIS — H2513 Age-related nuclear cataract, bilateral: Secondary | ICD-10-CM | POA: Diagnosis not present

## 2021-05-12 DIAGNOSIS — H43813 Vitreous degeneration, bilateral: Secondary | ICD-10-CM | POA: Diagnosis not present

## 2021-06-03 DIAGNOSIS — I499 Cardiac arrhythmia, unspecified: Secondary | ICD-10-CM | POA: Diagnosis not present

## 2021-06-03 DIAGNOSIS — I4891 Unspecified atrial fibrillation: Secondary | ICD-10-CM | POA: Diagnosis not present

## 2021-06-03 DIAGNOSIS — R059 Cough, unspecified: Secondary | ICD-10-CM | POA: Diagnosis not present

## 2021-06-04 DIAGNOSIS — I4891 Unspecified atrial fibrillation: Secondary | ICD-10-CM | POA: Diagnosis not present

## 2021-08-18 DIAGNOSIS — I340 Nonrheumatic mitral (valve) insufficiency: Secondary | ICD-10-CM | POA: Diagnosis not present

## 2021-08-18 DIAGNOSIS — I517 Cardiomegaly: Secondary | ICD-10-CM | POA: Diagnosis not present

## 2021-08-20 DIAGNOSIS — I4891 Unspecified atrial fibrillation: Secondary | ICD-10-CM | POA: Diagnosis not present

## 2021-08-20 DIAGNOSIS — Z7901 Long term (current) use of anticoagulants: Secondary | ICD-10-CM | POA: Diagnosis not present

## 2021-08-20 DIAGNOSIS — E782 Mixed hyperlipidemia: Secondary | ICD-10-CM | POA: Diagnosis not present

## 2021-08-20 DIAGNOSIS — I10 Essential (primary) hypertension: Secondary | ICD-10-CM | POA: Diagnosis not present

## 2022-11-11 ENCOUNTER — Other Ambulatory Visit (HOSPITAL_COMMUNITY): Payer: Self-pay

## 2022-11-13 ENCOUNTER — Other Ambulatory Visit: Payer: Self-pay

## 2022-11-13 ENCOUNTER — Other Ambulatory Visit (HOSPITAL_COMMUNITY): Payer: Self-pay

## 2022-11-13 MED ORDER — MELOXICAM 7.5 MG PO TABS
7.5000 mg | ORAL_TABLET | Freq: Every day | ORAL | 2 refills | Status: DC | PRN
  Filled 2023-07-01: qty 90, 90d supply, fill #0

## 2022-11-13 MED ORDER — POTASSIUM CHLORIDE CRYS ER 20 MEQ PO TBCR
20.0000 meq | EXTENDED_RELEASE_TABLET | Freq: Every day | ORAL | 0 refills | Status: DC
  Filled 2023-02-16 – 2023-03-16 (×2): qty 90, 90d supply, fill #0

## 2022-11-13 MED ORDER — LOSARTAN POTASSIUM 100 MG PO TABS
100.0000 mg | ORAL_TABLET | Freq: Every day | ORAL | 3 refills | Status: DC
  Filled 2022-11-13: qty 90, 90d supply, fill #0

## 2022-11-13 MED ORDER — HYDROCHLOROTHIAZIDE 12.5 MG PO TABS
12.5000 mg | ORAL_TABLET | Freq: Every day | ORAL | 3 refills | Status: DC
  Filled 2022-11-13: qty 90, 90d supply, fill #0

## 2022-11-16 ENCOUNTER — Other Ambulatory Visit: Payer: Self-pay

## 2022-11-16 ENCOUNTER — Other Ambulatory Visit (HOSPITAL_COMMUNITY): Payer: Self-pay

## 2022-11-16 MED ORDER — OMEPRAZOLE 20 MG PO CPDR
20.0000 mg | DELAYED_RELEASE_CAPSULE | Freq: Every day | ORAL | 1 refills | Status: DC
  Filled 2022-11-16: qty 90, 90d supply, fill #0
  Filled 2023-02-04: qty 90, 90d supply, fill #1

## 2022-12-09 ENCOUNTER — Other Ambulatory Visit (HOSPITAL_COMMUNITY): Payer: Self-pay

## 2022-12-09 MED ORDER — POTASSIUM CHLORIDE CRYS ER 20 MEQ PO TBCR
20.0000 meq | EXTENDED_RELEASE_TABLET | Freq: Every day | ORAL | 2 refills | Status: DC
  Filled 2022-12-09: qty 90, 90d supply, fill #0
  Filled 2023-06-10: qty 90, 90d supply, fill #1
  Filled 2023-09-08: qty 90, 90d supply, fill #2

## 2022-12-09 MED ORDER — LOSARTAN POTASSIUM 100 MG PO TABS
100.0000 mg | ORAL_TABLET | Freq: Every day | ORAL | 2 refills | Status: DC
  Filled 2023-02-04: qty 90, 90d supply, fill #0
  Filled 2023-05-12: qty 90, 90d supply, fill #1
  Filled 2023-09-01: qty 90, 90d supply, fill #2

## 2022-12-09 MED ORDER — MELOXICAM 7.5 MG PO TABS
7.5000 mg | ORAL_TABLET | Freq: Every day | ORAL | 1 refills | Status: DC | PRN
  Filled 2022-12-09: qty 90, 90d supply, fill #0
  Filled 2023-02-16 – 2023-03-16 (×2): qty 90, 90d supply, fill #1

## 2022-12-09 MED ORDER — HYDROCHLOROTHIAZIDE 12.5 MG PO TABS
12.5000 mg | ORAL_TABLET | Freq: Every day | ORAL | 2 refills | Status: DC
  Filled 2023-02-04: qty 90, 90d supply, fill #0
  Filled 2023-05-12: qty 90, 90d supply, fill #1
  Filled 2023-08-06: qty 90, 90d supply, fill #2

## 2022-12-09 MED ORDER — METOPROLOL SUCCINATE ER 100 MG PO TB24
100.0000 mg | ORAL_TABLET | Freq: Every day | ORAL | 0 refills | Status: DC
  Filled 2022-12-09: qty 90, 90d supply, fill #0

## 2022-12-10 ENCOUNTER — Other Ambulatory Visit: Payer: Self-pay

## 2022-12-10 ENCOUNTER — Other Ambulatory Visit (HOSPITAL_COMMUNITY): Payer: Self-pay

## 2023-02-04 ENCOUNTER — Other Ambulatory Visit (HOSPITAL_COMMUNITY): Payer: Self-pay

## 2023-02-04 ENCOUNTER — Other Ambulatory Visit: Payer: Self-pay

## 2023-02-08 ENCOUNTER — Other Ambulatory Visit (HOSPITAL_COMMUNITY): Payer: Self-pay

## 2023-02-08 MED ORDER — METOPROLOL SUCCINATE ER 100 MG PO TB24
100.0000 mg | ORAL_TABLET | Freq: Every day | ORAL | 0 refills | Status: DC
  Filled 2023-02-08 – 2023-02-16 (×2): qty 90, 90d supply, fill #0

## 2023-02-09 ENCOUNTER — Other Ambulatory Visit (HOSPITAL_COMMUNITY): Payer: Self-pay

## 2023-02-16 ENCOUNTER — Other Ambulatory Visit (HOSPITAL_COMMUNITY): Payer: Self-pay

## 2023-02-17 ENCOUNTER — Other Ambulatory Visit: Payer: Self-pay

## 2023-03-16 ENCOUNTER — Other Ambulatory Visit: Payer: Self-pay

## 2023-03-16 ENCOUNTER — Other Ambulatory Visit (HOSPITAL_COMMUNITY): Payer: Self-pay

## 2023-05-12 ENCOUNTER — Other Ambulatory Visit (HOSPITAL_COMMUNITY): Payer: Self-pay

## 2023-05-13 ENCOUNTER — Other Ambulatory Visit (HOSPITAL_COMMUNITY): Payer: Self-pay

## 2023-05-13 ENCOUNTER — Other Ambulatory Visit: Payer: Self-pay

## 2023-05-13 MED ORDER — OMEPRAZOLE 20 MG PO CPDR
20.0000 mg | DELAYED_RELEASE_CAPSULE | Freq: Every day | ORAL | 0 refills | Status: DC
  Filled 2023-05-13: qty 90, 90d supply, fill #0

## 2023-06-10 ENCOUNTER — Other Ambulatory Visit: Payer: Self-pay

## 2023-06-10 ENCOUNTER — Other Ambulatory Visit (HOSPITAL_COMMUNITY): Payer: Self-pay

## 2023-06-10 MED ORDER — METOPROLOL SUCCINATE ER 100 MG PO TB24
100.0000 mg | ORAL_TABLET | Freq: Every day | ORAL | 0 refills | Status: DC
  Filled 2023-06-10: qty 90, 90d supply, fill #0

## 2023-07-01 ENCOUNTER — Other Ambulatory Visit (HOSPITAL_COMMUNITY): Payer: Self-pay

## 2023-08-06 ENCOUNTER — Other Ambulatory Visit: Payer: Self-pay

## 2023-08-06 ENCOUNTER — Other Ambulatory Visit (HOSPITAL_COMMUNITY): Payer: Self-pay

## 2023-08-06 MED ORDER — OMEPRAZOLE 20 MG PO CPDR
20.0000 mg | DELAYED_RELEASE_CAPSULE | Freq: Every day | ORAL | 0 refills | Status: DC
  Filled 2023-08-06: qty 90, 90d supply, fill #0

## 2023-08-09 ENCOUNTER — Other Ambulatory Visit (HOSPITAL_COMMUNITY): Payer: Self-pay

## 2023-08-09 ENCOUNTER — Other Ambulatory Visit: Payer: Self-pay

## 2023-09-01 ENCOUNTER — Other Ambulatory Visit (HOSPITAL_COMMUNITY): Payer: Self-pay

## 2023-09-01 ENCOUNTER — Other Ambulatory Visit: Payer: Self-pay

## 2023-09-01 MED ORDER — METOPROLOL SUCCINATE ER 100 MG PO TB24
100.0000 mg | ORAL_TABLET | Freq: Every day | ORAL | 0 refills | Status: DC
  Filled 2023-09-01: qty 90, 90d supply, fill #0

## 2023-09-02 ENCOUNTER — Other Ambulatory Visit: Payer: Self-pay

## 2023-09-08 ENCOUNTER — Other Ambulatory Visit: Payer: Self-pay

## 2023-10-07 ENCOUNTER — Other Ambulatory Visit (HOSPITAL_COMMUNITY): Payer: Self-pay

## 2023-10-21 ENCOUNTER — Other Ambulatory Visit (HOSPITAL_COMMUNITY): Payer: Self-pay

## 2023-10-22 ENCOUNTER — Other Ambulatory Visit (HOSPITAL_BASED_OUTPATIENT_CLINIC_OR_DEPARTMENT_OTHER): Payer: Self-pay

## 2023-10-22 ENCOUNTER — Other Ambulatory Visit (HOSPITAL_COMMUNITY): Payer: Self-pay

## 2023-10-22 MED ORDER — MELOXICAM 7.5 MG PO TABS
ORAL_TABLET | ORAL | 0 refills | Status: DC
  Filled 2023-10-22: qty 90, 90d supply, fill #0

## 2023-10-25 ENCOUNTER — Other Ambulatory Visit (HOSPITAL_COMMUNITY): Payer: Self-pay

## 2023-11-02 ENCOUNTER — Other Ambulatory Visit (HOSPITAL_COMMUNITY): Payer: Self-pay

## 2023-11-02 ENCOUNTER — Other Ambulatory Visit: Payer: Self-pay

## 2023-11-02 MED ORDER — HYDROCHLOROTHIAZIDE 12.5 MG PO TABS
12.5000 mg | ORAL_TABLET | Freq: Every day | ORAL | 3 refills | Status: DC
  Filled 2023-11-02: qty 90, 90d supply, fill #0
  Filled 2024-01-31: qty 90, 90d supply, fill #1
  Filled 2024-04-25: qty 90, 90d supply, fill #2
  Filled 2024-07-23: qty 90, 90d supply, fill #3

## 2023-11-03 ENCOUNTER — Other Ambulatory Visit (HOSPITAL_COMMUNITY): Payer: Self-pay

## 2023-11-03 ENCOUNTER — Other Ambulatory Visit: Payer: Self-pay

## 2023-11-03 MED ORDER — OMEPRAZOLE 20 MG PO CPDR
20.0000 mg | DELAYED_RELEASE_CAPSULE | Freq: Every day | ORAL | 0 refills | Status: DC
  Filled 2023-11-03: qty 90, 90d supply, fill #0

## 2023-11-24 ENCOUNTER — Other Ambulatory Visit (HOSPITAL_COMMUNITY): Payer: Self-pay

## 2023-11-24 ENCOUNTER — Other Ambulatory Visit: Payer: Self-pay

## 2023-11-24 MED ORDER — LOSARTAN POTASSIUM 100 MG PO TABS
100.0000 mg | ORAL_TABLET | Freq: Every day | ORAL | 3 refills | Status: DC
  Filled 2023-11-24: qty 90, 90d supply, fill #0
  Filled 2024-02-22: qty 90, 90d supply, fill #1

## 2023-11-30 ENCOUNTER — Other Ambulatory Visit (HOSPITAL_COMMUNITY): Payer: Self-pay

## 2023-11-30 MED ORDER — METOPROLOL SUCCINATE ER 100 MG PO TB24
100.0000 mg | ORAL_TABLET | Freq: Every day | ORAL | 0 refills | Status: DC
  Filled 2023-11-30: qty 90, 90d supply, fill #0

## 2023-12-06 ENCOUNTER — Other Ambulatory Visit: Payer: Self-pay

## 2023-12-06 ENCOUNTER — Other Ambulatory Visit (HOSPITAL_COMMUNITY): Payer: Self-pay

## 2023-12-06 MED ORDER — POTASSIUM CHLORIDE CRYS ER 20 MEQ PO TBCR
20.0000 meq | EXTENDED_RELEASE_TABLET | Freq: Every day | ORAL | 2 refills | Status: DC
  Filled 2023-12-06: qty 90, 90d supply, fill #0
  Filled 2024-02-29: qty 90, 90d supply, fill #1
  Filled 2024-05-29: qty 90, 90d supply, fill #2

## 2024-01-04 LAB — COLOGUARD: COLOGUARD: NEGATIVE

## 2024-01-18 ENCOUNTER — Other Ambulatory Visit (HOSPITAL_COMMUNITY): Payer: Self-pay

## 2024-01-19 ENCOUNTER — Other Ambulatory Visit (HOSPITAL_COMMUNITY): Payer: Self-pay

## 2024-01-19 MED ORDER — MELOXICAM 7.5 MG PO TABS
ORAL_TABLET | ORAL | 0 refills | Status: DC
  Filled 2024-01-19: qty 90, 90d supply, fill #0

## 2024-02-01 ENCOUNTER — Other Ambulatory Visit: Payer: Self-pay

## 2024-02-01 ENCOUNTER — Other Ambulatory Visit (HOSPITAL_COMMUNITY): Payer: Self-pay

## 2024-02-01 MED ORDER — OMEPRAZOLE 20 MG PO CPDR
20.0000 mg | DELAYED_RELEASE_CAPSULE | Freq: Every day | ORAL | 0 refills | Status: DC
  Filled 2024-02-01: qty 90, 90d supply, fill #0

## 2024-02-01 MED ORDER — TRAMADOL HCL 50 MG PO TABS
50.0000 mg | ORAL_TABLET | Freq: Every day | ORAL | 0 refills | Status: DC | PRN
  Filled 2024-02-01: qty 10, 10d supply, fill #0

## 2024-02-22 ENCOUNTER — Other Ambulatory Visit (HOSPITAL_COMMUNITY): Payer: Self-pay

## 2024-02-22 ENCOUNTER — Other Ambulatory Visit: Payer: Self-pay

## 2024-02-23 ENCOUNTER — Other Ambulatory Visit: Payer: Self-pay

## 2024-02-28 ENCOUNTER — Other Ambulatory Visit: Payer: Self-pay

## 2024-02-28 ENCOUNTER — Other Ambulatory Visit (HOSPITAL_COMMUNITY): Payer: Self-pay

## 2024-02-28 MED ORDER — METOPROLOL SUCCINATE ER 100 MG PO TB24
100.0000 mg | ORAL_TABLET | Freq: Every day | ORAL | 0 refills | Status: DC
  Filled 2024-02-28: qty 90, 90d supply, fill #0

## 2024-04-14 ENCOUNTER — Telehealth: Payer: Self-pay | Admitting: *Deleted

## 2024-04-14 NOTE — Telephone Encounter (Signed)
   Pre-operative Risk Assessment    Patient Name: Rhonda Bell  DOB: 06/23/1946 MRN: [id number]   Date of last office visit: NONE Date of next office visit: NONE-PT WILL NEED A NEW PT APPT FOR PREOP CLEARANCE   Request for Surgical Clearance    Procedure:  LUMPECTOMY BREAST CANCER SURGERY  Date of Surgery:  Clearance TBD URGENT                               Surgeon:  DR. MATTHEW WAKEFIELD Surgeon's Group or Practice Name:  [facility] Phone number:  [phone] Fax number:  [phone] RUSSELL CHRISTINE, CMA   Type of Clearance Requested:   - Medical  - Pharmacy:  Hold [facility]ir[facility]     Type of Anesthesia:  General    Additional requests/questions:    [name]   04/14/2024, 10:57 AM

## 2024-04-14 NOTE — Telephone Encounter (Signed)
   Name: Orean Giarratano  DOB: 1945/11/12  MRN: [id number]  Primary Cardiologist: None  Chart reviewed as part of pre-operative protocol coverage. Because of Tiari Andringa past medical history and time since last visit, she will require a follow-up in-office visit in order to better assess preoperative cardiovascular risk. Not established with cardiology yet.  Needs ASAP/DOD/ First available for clearance due to breast cancer.   Pre-op covering staff: - Please schedule appointment and call patient to inform them. If patient already had an upcoming appointment within acceptable timeframe, please add pre-op clearance to the appointment notes so provider is aware. - Please contact requesting surgeon's office via preferred method (i.e, phone, fax) to inform them of need for appointment prior to surgery.   Lamarr Satterfield, NP  04/14/2024, 1:32 PM

## 2024-04-14 NOTE — Telephone Encounter (Signed)
 Left message for pt to call back to schedule new pt appt for preop clearance. Pt can be scheduled per Lynwood Axe, Scheduling Supervisor: new preop protocol new template for Artist Pouch, [facility] will now be able to see new pt appt's, there are specific held slots for preop to use a new pt appt for preop clearance.

## 2024-04-14 NOTE — Telephone Encounter (Signed)
 Pt called back and scheduler her new pt appt for preop under the new preop protocol. New template for Rhonda Bell, PAC. 04/18/24 Rhonda Bell, PAC

## 2024-04-17 ENCOUNTER — Encounter (HOSPITAL_BASED_OUTPATIENT_CLINIC_OR_DEPARTMENT_OTHER): Payer: Self-pay

## 2024-04-18 ENCOUNTER — Other Ambulatory Visit (HOSPITAL_COMMUNITY): Payer: Self-pay

## 2024-04-18 ENCOUNTER — Encounter: Payer: Self-pay | Admitting: Cardiology

## 2024-04-18 ENCOUNTER — Ambulatory Visit: Attending: Cardiology | Admitting: Cardiology

## 2024-04-18 ENCOUNTER — Other Ambulatory Visit: Payer: Self-pay

## 2024-04-18 VITALS — BP 146/88 | HR 72 | Ht 65.0 in | Wt 162.2 lb

## 2024-04-18 DIAGNOSIS — I4891 Unspecified atrial fibrillation: Secondary | ICD-10-CM | POA: Diagnosis not present

## 2024-04-18 DIAGNOSIS — Z01810 Encounter for preprocedural cardiovascular examination: Secondary | ICD-10-CM

## 2024-04-18 DIAGNOSIS — Z79899 Other long term (current) drug therapy: Secondary | ICD-10-CM | POA: Diagnosis not present

## 2024-04-18 MED ORDER — TELMISARTAN 80 MG PO TABS
80.0000 mg | ORAL_TABLET | Freq: Every day | ORAL | 1 refills | Status: DC
  Filled 2024-04-18: qty 90, 90d supply, fill #0
  Filled 2024-07-10: qty 90, 90d supply, fill #1

## 2024-04-18 MED ORDER — MELOXICAM 7.5 MG PO TABS
7.5000 mg | ORAL_TABLET | Freq: Every day | ORAL | 0 refills | Status: DC | PRN
  Filled 2024-04-18: qty 90, 90d supply, fill #0

## 2024-04-18 NOTE — Patient Instructions (Addendum)
 Medication Instructions:   STOP TAKING LOSARTAN  NOW  START TAKING TELMISARTAN 80 MG BY MOUTH DAILY  *If you need a refill on your cardiac medications before your next appointment, please call your pharmacy*   You have been referred to ONE OF OUR ELECTROPHYSIOLOGIST FOR CONSIDERATION OF WATCHMAN PROCEDURE--THEIR DESIGNATED SCHEDULER WILL REACH OUT TO YOU BY PHONE TO ARRANGE THIS APPOINTMENT     Lab Work:  IN ONE WEEK AT A [facility] NEAR YOU--AFTER BEING ON TELMISARTAN FOR A WEEK--BMET  If you have labs (blood work) drawn today and your tests are completely normal, you will receive your results only by: [employer] (if you have MyChart) OR A paper copy in the mail If you have any lab test that is abnormal or we need to change your treatment, we will call you to review the results.    Testing/Procedures:  Your physician has requested that you have an echocardiogram. Echocardiography is a painless test that uses sound waves to create images of your heart. It provides your doctor with information about the size and shape of your heart and how well your heart's chambers and valves are working. This procedure takes approximately one hour. There are no restrictions for this procedure. Please do NOT wear cologne, perfume, aftershave, or lotions (deodorant is allowed). Please arrive 15 minutes prior to your appointment time.  Please note: We ask at that you not bring children with you during ultrasound (echo/ vascular) testing. Due to room size and safety concerns, children are not allowed in the ultrasound rooms during exams. Our front office staff cannot provide observation of children in our lobby area while testing is being conducted. An adult accompanying a patient to their appointment will only be allowed in the ultrasound room at the discretion of the ultrasound technician under special circumstances. We apologize for any inconvenience.   Follow-Up:  2 MONTHS WITH AN EXTENDER IN  THE OFFICE

## 2024-04-18 NOTE — Progress Notes (Addendum)
 Cardiology Office Note:   Date:  04/18/2024  ID:  Rhonda Bell, DOB 1945/12/13, MRN [id number] PCP: Norleen Nohemi Shuck, MD  [facility] Providers Cardiologist:  None    History of Present Illness:   Discussed the use of AI scribe software for clinical note transcription with the patient, who gave verbal consent to proceed.  History of Present Illness Rhonda Bell is a 78 year old female with atrial fibrillation who presents for pre-surgical clearance for ductal carcinoma surgery. She was referred by her surgeon for pre-surgical clearance due to atrial fibrillation.  She is planning to undergo surgery for ductal carcinoma and requires pre-surgical clearance due to her history of atrial fibrillation. She has never experienced symptoms of atrial fibrillation, such as chest pain or shortness of breath, and remains physically active, including significant activity at work (including moving furniture) and participating in recreational activities.  Years ago, patient reports she had an incident with an EKG that was blank, leading to a humorous exchange with her doctor. Despite this, she was advised to see a cardiologist, who dismissed her concerns after evaluation. More recently she saw Dr. Dal at [facility], who noted afib and suggested further testing, including a Holter monitor, but she declined due to cost concerns and lack of symptoms.  Her mother and sister both had breast cancer over the age of 18, and her mother also had a pacemaker later in life due to heart issues.  She is currently on blood pressure medications, including metoprolol , hydrochlorothiazide , and losartan . Her blood pressure readings have been elevated, with recent measurements of 146/88 and 150/79. There have been no recent changes in her medication dosages.  No chest pain, shortness of breath, or any symptoms commonly associated with heart disease. She occasionally feels a 'little skip' in her pulse but  remains active and without significant health issues.   Today patient denies chest pain, shortness of breath, lower extremity edema, fatigue, palpitations, melena, hematuria, hemoptysis, diaphoresis, weakness, presyncope, syncope, orthopnea, and PND.   Studies Reviewed:    EKG:   EKG Interpretation Date/Time:  Tuesday April 18 2024 11:11:51 EDT Ventricular Rate:  69 PR Interval:    QRS Duration:  82 QT Interval:  414 QTC Calculation: 443 R Axis:   124  Text Interpretation: Atrial fibrillation Right axis deviation Low voltage QRS Septal infarct , age undetermined No previous ECGs available Confirmed by Trudy Birmingham [phone]) on 04/18/2024 11:22:27 AM    Risk Assessment/Calculations:    CHA2DS2-VASc Score = 4   This indicates a 4.8% annual risk of stroke. The patient's score is based upon: CHF History: 0 HTN History: 1 Diabetes History: 0 Stroke History: 0 Vascular Disease History: 0 Age Score: 2 Gender Score: 1    HYPERTENSION CONTROL Vitals:   04/18/24 1109 04/18/24 1157  BP: (!) 146/80 (!) 146/88    The patient's blood pressure is elevated above target today.  In order to address the patient's elevated BP: Blood pressure will be monitored at home to determine if medication changes need to be made.; A new medication was prescribed today.           Physical Exam:   VS:  BP (!) 146/88   Pulse 72   Ht 5' 5 (1.651 m)   Wt 162 lb 3.2 oz (73.6 kg)   SpO2 96%   BMI 26.99 kg/m    Wt Readings from Last 3 Encounters:  04/18/24 162 lb 3.2 oz (73.6 kg)  11/15/18 190 lb (86.2 kg)  06/03/18 194 lb (88 kg)     Physical Exam Vitals reviewed.  Constitutional:      Appearance: Normal appearance.  HENT:     Head: Normocephalic.     Nose: Nose normal.   Eyes:     Pupils: Pupils are equal, round, and reactive to light.    Cardiovascular:     Rate and Rhythm: Normal rate. Rhythm irregular.     Pulses: Normal pulses.     Heart sounds: Normal heart sounds. No murmur  heard.    No friction rub. No gallop.  Pulmonary:     Effort: Pulmonary effort is normal.     Breath sounds: Normal breath sounds.  Abdominal:     General: Abdomen is flat.   Musculoskeletal:     Right lower leg: No edema.     Left lower leg: No edema.   Skin:    General: Skin is warm and dry.     Capillary Refill: Capillary refill takes less than 2 seconds.   Neurological:     General: No focal deficit present.     Mental Status: She is alert and oriented to person, place, and time.   Psychiatric:        Mood and Affect: Mood normal.        Behavior: Behavior normal.        Thought Content: Thought content normal.        Judgment: Judgment normal.      ASSESSMENT AND PLAN:    Assessment & Plan Atrial fibrillation Secondary hypercoagulable state CHA2DS2-VASc Score = 4  Suspect longstanding persistent afib since at least 2022. Discussed with patient that there is a significant risk of stroke associated with Afib. Based on her risk factors, calculated at approximately 5% annually. Current guidelines recommend anticoagulation for stroke prevention due to her risk factors, including age, gender, and hypertension. However, she is hesitant to start anticoagulation due to concerns about bleeding risks, despite reassurance about the safety profile of newer anticoagulants like Eliquis and Xarelto. Discussed the potential for a Watchman device as a long-term alternative to anticoagulation.  - Refer to electrophysiology for evaluation of Watchman device candidacy given her longterm OAC concerns. - Discuss anticoagulation options with her, emphasizing the benefits and overall safety of latest DOACs. She continues to defer OAC at this time. - Continue Toprol  XL 100mg  (takes primarily for hypertension) - Given afib, needs an echo (but this should not delay surgery).  Essential hypertension Hypertension is present with recent readings of 146/88 mmHg and 150/79 mmHg, indicating suboptimal  control. She is on maximum doses of losartan  and hydrochlorothiazide , with metoprolol  primarily for heart rate control. Discussed switching to a more potent ARB, telmisartan , to improve blood pressure control. The goal is to achieve a blood pressure of less than 130/80 mmHg. - Switch from losartan  to telmisartan  80 mg daily due to increased efficacy and longer duration of action. - Continue hydrochlorothiazide  12.5 mg - Continue Toprol  XL 100mg  daily - Monitor blood pressure at home and record readings - Check potassium levels one week after starting telmisartan  - Reassess blood pressure control in a follow-up appointment  Ductal carcinoma in situ of breast Diagnosed with ductal carcinoma in situ, requiring surgical intervention. No significant cardiac risk factors identified that would contraindicate surgery. Functional capacity is excellent, and perioperative cardiac risk is low, with a risk of major cardiac event under 1%. - Stable for surgery from a cardiac standpoint - No need for preoperative stress testing or pre-operative echocardiogram -  She may proceed with surgery scheduling Regarding ASA therapy, we recommend continuation of ASA throughout the perioperative period.  However, if the surgeon feels that cessation of ASA is required in the perioperative period, it may be stopped 5-7 days prior to surgery with a plan to resume it as soon as felt to be feasible from a surgical standpoint in the post-operative period.              Signed, Artist Pouch, PA-C

## 2024-04-19 ENCOUNTER — Other Ambulatory Visit: Payer: Self-pay | Admitting: General Surgery

## 2024-04-19 DIAGNOSIS — D0511 Intraductal carcinoma in situ of right breast: Secondary | ICD-10-CM

## 2024-04-24 ENCOUNTER — Telehealth: Payer: Self-pay | Admitting: *Deleted

## 2024-04-24 ENCOUNTER — Other Ambulatory Visit: Payer: Self-pay | Admitting: General Surgery

## 2024-04-24 DIAGNOSIS — D0511 Intraductal carcinoma in situ of right breast: Secondary | ICD-10-CM

## 2024-04-24 NOTE — Telephone Encounter (Signed)
-----   Message from [place] H sent at 04/24/2024  3:08 PM EDT ----- Regarding: FW: REFER TO EP FOR CONSIDERATION OF WATCHMAN PER EVAN WILLIAMS PA-C Patient has been scheduled with Dr. Kennyth on 8/28 for consideration of watchman! ----- Message ----- From: Gladis Porter HERO, LPN Sent: 11/26/7972  12:21 PM EDT To: Charlott HERO Hurst; Cvd-Ep Scheduling Subject: REFER TO EP FOR CONSIDERATION OF WATCHMAN PE#  Artist saw this pt in clinic today and referred her to EP for consideration of Watchman  Referral is in the system and the pt is aware you will be calling her to arrange this appt.    Can you please schedule and shoot me the date thereafter?   Thanks so much, [employer]

## 2024-04-25 ENCOUNTER — Ambulatory Visit: Admitting: Nurse Practitioner

## 2024-04-29 ENCOUNTER — Ambulatory Visit: Payer: Self-pay

## 2024-04-29 LAB — BASIC METABOLIC PANEL WITH GFR
BUN/Creatinine Ratio: 22 (ref 12–28)
BUN: 18 mg/dL (ref 8–27)
CO2: 22 mmol/L (ref 20–29)
Calcium: 9.2 mg/dL (ref 8.7–10.3)
Chloride: 101 mmol/L (ref 96–106)
Creatinine, Ser: 0.83 mg/dL (ref 0.57–1.00)
Glucose: 88 mg/dL (ref 70–99)
Potassium: 3.6 mmol/L (ref 3.5–5.2)
Sodium: 144 mmol/L (ref 134–144)
eGFR: 73 mL/min/1.73 (ref >59)

## 2024-05-01 ENCOUNTER — Other Ambulatory Visit: Payer: Self-pay

## 2024-05-01 ENCOUNTER — Other Ambulatory Visit (HOSPITAL_COMMUNITY): Payer: Self-pay

## 2024-05-01 MED ORDER — OMEPRAZOLE 20 MG PO CPDR
20.0000 mg | DELAYED_RELEASE_CAPSULE | Freq: Every day | ORAL | 0 refills | Status: DC
  Filled 2024-05-01: qty 90, 90d supply, fill #0

## 2024-05-01 NOTE — Telephone Encounter (Signed)
 Clarification obtained about the pts mychart message.   Pt did confirm with me that she did stop losartan  on 7/2, as advised at last OV appt with Artist Pouch PA-C.  She states she has been taking telmisartan  80 mg po daily and tracking her numbers daily.  Pt states 2 additional records from 7/13 and 7/14 are as so:  7/13--149/99  7/14--147/80  Pt states she is worried her BP is higher than before since making the switch from losartan  to telmisartan .  Advised her to make sure she is taking her meds in the morning and waiting a full hour before taking her BP.  Pt states she will start doing that.   Pt just wanted to make sure with Artist Pouch PA-C that he is ok with her numbers being she is having surgery soon.  She said she has her pre-admission visit tomorrow.   Advised her to continue with her telmisartan  regimen and I will route this message back to Evan to further review and advise on.  She is aware we will follow-up with her accordingly thereafter.   Pt verbalized understanding and agrees with this plan.

## 2024-05-01 NOTE — Pre-Procedure Instructions (Signed)
 Surgical Instructions   Your procedure is scheduled on Monday, July 21st. Report to [facility] Main Entrance A at 07:30 A.M., then check in with the Admitting office. Any questions or running late day of surgery: call [phone]  Questions prior to your surgery date: call [phone], Monday-Friday, 8am-4pm. If you experience any cold or flu symptoms such as cough, fever, chills, shortness of breath, etc. between now and your scheduled surgery, please notify us  at the above number.     Remember:  Do not eat after midnight the night before your surgery   You may drink clear liquids until 06:30 AM the morning of your surgery.   Clear liquids allowed are: Water, Non-Citrus Juices (without pulp), Carbonated Beverages, Clear Tea (no milk, honey, etc.), Black Coffee Only (NO MILK, CREAM OR POWDERED CREAMER of any kind), and Gatorade.  Patient Instructions  The night before surgery:  No food after midnight. ONLY clear liquids after midnight  The day of surgery (if you do NOT have diabetes):  Drink ONE (1) Pre-Surgery Clear Ensure by 06:30 AM the morning of surgery. Drink in one sitting. Do not sip.  This drink was given to you during your hospital  pre-op appointment visit.  Nothing else to drink after completing the  Pre-Surgery Clear Ensure.          If you have questions, please contact your surgeon's office.    Take these medicines the morning of surgery with A SIP OF WATER  cetirizine (ZYRTEC)  metoprolol  succinate (TOPROL -XL)  omeprazole  (PRILOSEC)    Follow your surgeon's instructions on when to stop Aspirin.  If no instructions were given by your surgeon then you will need to call the office to get those instructions.    One week prior to surgery, STOP taking any Aleve, Naproxen, Ibuprofen, Motrin, Advil, Goody's, BC's, all herbal medications, fish oil, and non-prescription vitamins. This includes meloxicam  (MOBIC ).                     Do NOT Smoke (Tobacco/Vaping)  for 24 hours prior to your procedure.  If you use a CPAP at night, you may bring your mask/headgear for your overnight stay.   You will be asked to remove any contacts, glasses, piercing's, hearing aid's, dentures/partials prior to surgery. Please bring cases for these items if needed.    Patients discharged the day of surgery will not be allowed to drive home, and someone needs to stay with them for 24 hours.  SURGICAL WAITING ROOM VISITATION Patients may have no more than 2 support people in the waiting area - these visitors may rotate.   Pre-op nurse will coordinate an appropriate time for 1 ADULT support person, who may not rotate, to accompany patient in pre-op.  Children under the age of 25 must have an adult with them who is not the patient and must remain in the main waiting area with an adult.  If the patient needs to stay at the hospital during part of their recovery, the visitor guidelines for inpatient rooms apply.  Please refer to the [facility] website for the visitor guidelines for any additional information.   If you received a COVID test during your pre-op visit  it is requested that you wear a mask when out in public, stay away from anyone that may not be feeling well and notify your surgeon if you develop symptoms. If you have been in contact with anyone that has tested positive in the last 10 days  please notify you surgeon.      Pre-operative CHG Bathing Instructions   You can play a key role in reducing the risk of infection after surgery. Your skin needs to be as free of germs as possible. You can reduce the number of germs on your skin by washing with CHG (chlorhexidine gluconate) soap before surgery. CHG is an antiseptic soap that kills germs and continues to kill germs even after washing.   DO NOT use if you have an allergy to chlorhexidine/CHG or antibacterial soaps. If your skin becomes reddened or irritated, stop using the CHG and notify one of our RNs at  [phone].              TAKE A SHOWER THE NIGHT BEFORE SURGERY AND THE DAY OF SURGERY    Please keep in mind the following:  DO NOT shave, including legs and underarms, 48 hours prior to surgery.   You may shave your face before/day of surgery.  Place clean sheets on your bed the night before surgery Use a clean washcloth (not used since being washed) for each shower. DO NOT sleep with pet's night before surgery.  CHG Shower Instructions:  Wash your face and private area with normal soap. If you choose to wash your hair, wash first with your normal shampoo.  After you use shampoo/soap, rinse your hair and body thoroughly to remove shampoo/soap residue.  Turn the water OFF and apply half the bottle of CHG soap to a CLEAN washcloth.  Apply CHG soap ONLY FROM YOUR NECK DOWN TO YOUR TOES (washing for 3-5 minutes)  DO NOT use CHG soap on face, private areas, open wounds, or sores.  Pay special attention to the area where your surgery is being performed.  If you are having back surgery, having someone wash your back for you may be helpful. Wait 2 minutes after CHG soap is applied, then you may rinse off the CHG soap.  Pat dry with a clean towel  Put on clean pajamas    Additional instructions for the day of surgery: DO NOT APPLY any lotions, deodorants, cologne, or perfumes.   Do not wear jewelry or makeup Do not wear nail polish, gel polish, artificial nails, or any other type of covering on natural nails (fingers and toes) Do not bring valuables to the hospital. [facility] is not responsible for valuables/personal belongings. Put on clean/comfortable clothes.  Please brush your teeth.  Ask your nurse before applying any prescription medications to the skin.

## 2024-05-01 NOTE — Telephone Encounter (Signed)
 Trudy Birmingham, PA-C to Me (Selected Message)     05/01/24 10:40 AM It is highly unusual that BP has risen since changing to Telmisartan . There is significant clinical evidence to support Telmisartan  as a stronger BP medication compared to Losartan . Has anything else changed for her: diet, stress, sleep, etc? From a preoperative standpoint, I am not worried about those numbers. I think it would be reasonable to continue to track BP (1-2 hours after medications) for another week. However, if she is uncomfortable with this plan, she can switch back to Losartan  100mg .    [place] Trudy, PA-C    Will send recommendations per [place] Trudy PA-C (indicated above), to the pts active mychart account to review.

## 2024-05-01 NOTE — Telephone Encounter (Signed)
 Pt requesting a c/b in regards to her Mychart message

## 2024-05-02 ENCOUNTER — Encounter (HOSPITAL_COMMUNITY)
Admission: RE | Admit: 2024-05-02 | Discharge: 2024-05-02 | Disposition: A | Discharge status: Home / Self Care | Source: Ambulatory Visit | Attending: General Surgery | Admitting: General Surgery

## 2024-05-02 ENCOUNTER — Other Ambulatory Visit: Payer: Self-pay

## 2024-05-02 ENCOUNTER — Encounter (HOSPITAL_COMMUNITY): Payer: Self-pay

## 2024-05-02 VITALS — BP 157/84 | HR 71 | Temp 98.1°F | Resp 16 | Ht 65.0 in | Wt 161.5 lb

## 2024-05-02 DIAGNOSIS — K219 Gastro-esophageal reflux disease without esophagitis: Secondary | ICD-10-CM | POA: Insufficient documentation

## 2024-05-02 DIAGNOSIS — I4819 Other persistent atrial fibrillation: Secondary | ICD-10-CM | POA: Diagnosis not present

## 2024-05-02 DIAGNOSIS — D0510 Intraductal carcinoma in situ of unspecified breast: Secondary | ICD-10-CM | POA: Insufficient documentation

## 2024-05-02 DIAGNOSIS — Z79899 Other long term (current) drug therapy: Secondary | ICD-10-CM | POA: Insufficient documentation

## 2024-05-02 DIAGNOSIS — Z01818 Encounter for other preprocedural examination: Secondary | ICD-10-CM | POA: Diagnosis present

## 2024-05-02 DIAGNOSIS — I10 Essential (primary) hypertension: Secondary | ICD-10-CM | POA: Insufficient documentation

## 2024-05-02 DIAGNOSIS — Z7982 Long term (current) use of aspirin: Secondary | ICD-10-CM | POA: Diagnosis not present

## 2024-05-02 HISTORY — DX: Gastro-esophageal reflux disease without esophagitis: K21.9

## 2024-05-02 HISTORY — DX: Cardiac arrhythmia, unspecified: I49.9

## 2024-05-02 LAB — CBC
HCT: 39.4 % (ref 36.0–46.0)
Hemoglobin: 12.8 g/dL (ref 12.0–15.0)
MCH: 29.8 pg (ref 26.0–34.0)
MCHC: 32.5 g/dL (ref 30.0–36.0)
MCV: 91.8 fL (ref 80.0–100.0)
Platelets: 199 K/uL (ref 150–400)
RBC: 4.29 MIL/uL (ref 3.87–5.11)
RDW: 13.1 % (ref 11.5–15.5)
WBC: 6.5 K/uL (ref 4.0–10.5)
nRBC: 0 % (ref 0.0–0.2)

## 2024-05-02 NOTE — Progress Notes (Signed)
 PCP - Dr. Nohemi Rush Cardiologist -  Has seen [facility].  Cardiac clearance from [facility] 04/18/2024.  Echo is scheduled for 06/12/2024 EP Cardiologist: Scheduled to see Dr. Kennyth 06/15/2024 for possible Watchman  PPM/ICD - denies Device Orders - na Rep Notified - na  Chest x-ray - na EKG - 04/18/2024 Stress Test -  ECHO - 08/18/2021; Echo scheduled 06/12/2024 Cardiac Cath -   Sleep Study - denies CPAP - na  Non-diabetic  Blood Thinner Instructions:denies Aspirin Instructions: follow surgeon's instructions  ERAS Protcol -Ensure until 0630  Anesthesia review: Yes. Cardiac clearance, HTN, A-fib  Patient denies shortness of breath, fever, cough and chest pain at PAT appointment   All instructions explained to the patient, with a verbal understanding of the material. Patient agrees to go over the instructions while at home for a better understanding. Patient also instructed to self quarantine after being tested for COVID-19. The opportunity to ask questions was provided.

## 2024-05-03 NOTE — Progress Notes (Signed)
 Anesthesia Chart Review:  78 year old female follows with cardiology for history of HTN and persistent atrial fibrillation not on anticoagulation.  Seen by Artist Pouch, PA-C on 04/18/2024 for preop evaluation.  Per note, Diagnosed with ductal carcinoma in situ, requiring surgical intervention. No significant cardiac risk factors identified that would contraindicate surgery. Functional capacity is excellent, and perioperative cardiac risk is low, with a risk of major cardiac event under 1%. - Stable for surgery from a cardiac standpoint - No need for preoperative stress testing or pre-operative echocardiogram - She may proceed with surgery scheduling Regarding ASA therapy, we recommend continuation of ASA throughout the perioperative period.  However, if the surgeon feels that cessation of ASA is required in the perioperative period, it may be stopped 5-7 days prior to surgery with a plan to resume it as soon as felt to be feasible from a surgical standpoint in the post-operative period.  History of GERD on PPI.  Preop labs reviewed, WNL.  EKG 04/18/2024: Atrial fibrillation.  Rate 69. Right axis deviation. Low voltage QRS. Septal infarct , age undetermined   Lynwood Geofm RIGGERS [facility] Short Stay Center/Anesthesiology Phone [phone] 05/03/2024 1:12 PM

## 2024-05-03 NOTE — Anesthesia Preprocedure Evaluation (Addendum)
 Anesthesia Evaluation  Patient identified by MRN, date of birth, ID band Patient awake    Reviewed: Allergy & Precautions, NPO status , Patient's Chart, lab work & pertinent test results  History of Anesthesia Complications Negative for: history of anesthetic complications  Airway Mallampati: II  TM Distance: >3 FB Neck ROM: Full    Dental  (+) Missing, Teeth Intact,    Pulmonary neg pulmonary ROS, neg sleep apnea, neg COPD, Patient abstained from smoking.Not current smoker   Pulmonary exam normal breath sounds clear to auscultation       Cardiovascular Exercise Tolerance: Good METShypertension, Pt. on medications (-) CAD and (-) Past MI + dysrhythmias Atrial Fibrillation  Rhythm:Irregular Rate:Normal - Systolic murmurs Afib not on blood thinners. Very active, good METS   Neuro/Psych negative neurological ROS  negative psych ROS   GI/Hepatic ,GERD  Controlled and Medicated,,(+)     (-) substance abuse    Endo/Other  neg diabetes    Renal/GU negative Renal ROS     Musculoskeletal   Abdominal   Peds  Hematology   Anesthesia Other Findings Past Medical History: No date: Arthritis No date: Dysrhythmia     Comment:  A-fib No date: GERD (gastroesophageal reflux disease) No date: Hypertension  Reproductive/Obstetrics                              Anesthesia Physical Anesthesia Plan  ASA: 3  Anesthesia Plan: General   Post-op Pain Management: Tylenol  PO (pre-op)*   Induction: Intravenous  PONV Risk Score and Plan: 3 and Ondansetron , Dexamethasone  and Treatment may vary due to age or medical condition  Airway Management Planned: LMA  Additional Equipment: None  Intra-op Plan:   Post-operative Plan: Extubation in OR  Informed Consent: I have reviewed the patients History and Physical, chart, labs and discussed the procedure including the risks, benefits and alternatives for the  proposed anesthesia with the patient or authorized representative who has indicated his/her understanding and acceptance.     Dental advisory given  Plan Discussed with: CRNA and Surgeon  Anesthesia Plan Comments: (Discussed risks of anesthesia with patient, including PONV, sore throat, lip/dental/eye damage. Rare risks discussed as well, such as cardiorespiratory and neurological sequelae, and allergic reactions. Discussed the role of CRNA in patient's perioperative care. Patient understands. Patient has listed allergy to PCN - rash Severe blistering skin reaction (SJS/TEN)? no Liver or kidney injury caused by PCN? no Hemolytic anemia from PCN? no Drug fever? no Painful swollen joints? no Severe reaction involving inside of mouth, eye, or genital ulcers? no Based on current evidence Zachary et al, J Allergy Clin Immunol Pract, 2019), will proceed with cefazolin  use: Yes  )         Anesthesia Quick Evaluation

## 2024-05-04 ENCOUNTER — Telehealth: Payer: Self-pay | Admitting: Radiation Oncology

## 2024-05-04 NOTE — Telephone Encounter (Signed)
 Called pt to schedule consultation. Pt requested week of 7/28. Appt made 7/29 @ 9:30am.  Still pending imaging from [facility]; contacted imaging team who advised images will be sent to [facility] 7/17.

## 2024-05-05 ENCOUNTER — Ambulatory Visit
Admission: RE | Admit: 2024-05-05 | Discharge: 2024-05-05 | Disposition: A | Discharge status: Home / Self Care | Source: Ambulatory Visit | Attending: General Surgery | Admitting: General Surgery

## 2024-05-05 DIAGNOSIS — D0511 Intraductal carcinoma in situ of right breast: Secondary | ICD-10-CM

## 2024-05-05 HISTORY — PX: BREAST BIOPSY: SHX20

## 2024-05-07 NOTE — H&P (View-Only) (Signed)
 78 year old female who underwent a screening mammogram at an outside facility. I have only the reports of the not able to view the images. She has heterogeneously dense breasts. She has an indeterminate asymmetry in the central posterior right breast. The left breast is stable. She underwent a biopsy with a ribbon clip placement. 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 She is here today to discuss her options. She had a very large hematoma after a biopsy that is slowly resolving. She has also been previously diagnosed with atrial fibrillation/  Review of Systems: A complete review of systems was obtained from the patient. I have reviewed this information and discussed as appropriate with the patient. See HPI as well for other ROS.  Review of Systems  Constitutional: Positive for weight loss (intentional).  All other systems reviewed and are negative.  Medical History: Past Medical History:  Diagnosis Date  Arthritis  GERD (gastroesophageal reflux disease)  History of cancer  Hypertension   Past Surgical History:  Procedure Laterality Date  Ear surgery 1969  thyroid  surgery 1988  1/2 removed.  Breast Surgery  Face surgery  TONSILLECTOMY & ADENOIDECTOMY   Allergies  Allergen Reactions  Bacitracin Zinc-Polymyxin B Itching  Band aid reaction  Lisinopril Cough  Sulfa (Sulfonamide Antibiotics) Other (See Comments)  Sumatriptan Succinate Muscle Pain  Penicillins Rash  Sulfasalazine Rash   Current Outpatient Medications on File Prior to Visit  Medication Sig Dispense Refill  aspirin 81 MG EC tablet Take 81 mg by mouth  cetirizine (ZYRTEC) 10 MG tablet Take 10 mg by mouth once daily  cholecalciferol (VITAMIN D3) 1000 unit tablet Take 1,000 Units by mouth once daily  glucosamine HCl/chondroitin su (GLUCOSAMINE-CHONDROITIN ORAL) Take by mouth  hydroCHLOROthiazide  (HYDRODIURIL ) 12.5 MG tablet Take  12.5 mg by mouth once daily  losartan  (COZAAR ) 100 MG tablet Take 100 mg by mouth once daily  meloxicam  (MOBIC ) 7.5 MG tablet Take 1 tablet (7.5 mg total) by mouth daily as needed (pain).  metoprolol  SUCCinate (TOPROL -XL) 100 MG XL tablet Take 100 mg by mouth once daily  omeprazole  (PRILOSEC) 20 MG DR capsule Take 20 mg by mouth once daily  potassium chloride  (KLOR-CON  M20) 20 MEQ ER tablet Take 20 mEq by mouth once daily  vitamin B complex (VITAMINS B COMPLEX ORAL) Take by mouth  .   Family History  Problem Relation Age of Onset  Breast cancer Mother  Stomach cancer Father  Esophageal cancer Father    Social History   Tobacco Use  Smoking Status Never  Smokeless Tobacco Never  Marital status: Unknown  Tobacco Use  Smoking status: Never  Smokeless tobacco: Never  Vaping Use  Vaping status: Never Used  Substance and Sexual Activity  Alcohol use: Never  Drug use: Never   Objective:   Vitals:  04/13/24 1521 04/13/24 1522  BP: (!) 150/79  Pulse: 84  Temp: 36.8 C (98.3 F)  SpO2: 98%  Weight: 72.9 kg (160 lb 12.8 oz)  Height: 165.1 cm (5' 5)    Body mass index is 26.76 kg/m.  Physical Exam Vitals reviewed.  Constitutional:  Appearance: Normal appearance.  Chest:  Breasts: Right: No inverted nipple, mass or nipple discharge.  Left: No inverted nipple, mass or nipple discharge.  Comments: Large right breast hematoma Lymphadenopathy:  Upper Body:  Right upper body: No supraclavicular or axillary adenopathy.  Left upper body: No supraclavicular or axillary adenopathy.  Neurological:  Mental  Status: She is alert.     Assessment and Plan:   Ductal carcinoma in situ (DCIS) of right breast  right breast seed guided lumpectomy  We discussed the staging and pathophysiology of breast cancer. We discussed all of the different options for treatment for breast cancer including surgery, radiation therapy, and antiestrogen therapy.  We discussed the options for  treatment of the breast cancer which included lumpectomy versus a mastectomy. We discussed the performance of the lumpectomy with radioactive seed placement. We discussed a 5-10% chance of a positive margin requiring reexcision in the operating room. We also discussed that she will likely need radiation therapy if she undergoes lumpectomy. We discussed mastectomy and the postoperative care for that as well. Mastectomy can be followed by reconstruction. The decision for lumpectomy vs mastectomy has no impact on decision for chemotherapy. Most mastectomy patients will not need radiation therapy. We discussed that there is no difference in her survival whether she undergoes lumpectomy with radiation therapy or antiestrogen therapy versus a mastectomy. There is also no real difference between her recurrence in the breast.  We discussed the risks of operation including bleeding, infection, possible reoperation. She understands her further therapy will be based on what her stages at the time of her operation.

## 2024-05-07 NOTE — H&P (Signed)
 78 year old female who underwent a screening mammogram at an outside facility. I have only the reports of the not able to view the images. She has heterogeneously dense breasts. She has an indeterminate asymmetry in the central posterior right breast. The left breast is stable. She underwent a biopsy with a ribbon clip placement. 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 She is here today to discuss her options. She had a very large hematoma after a biopsy that is slowly resolving. She has also been previously diagnosed with atrial fibrillation/  Review of Systems: A complete review of systems was obtained from the patient. I have reviewed this information and discussed as appropriate with the patient. See HPI as well for other ROS.  Review of Systems  Constitutional: Positive for weight loss (intentional).  All other systems reviewed and are negative.  Medical History: Past Medical History:  Diagnosis Date  Arthritis  GERD (gastroesophageal reflux disease)  History of cancer  Hypertension   Past Surgical History:  Procedure Laterality Date  Ear surgery 1969  thyroid  surgery 1988  1/2 removed.  Breast Surgery  Face surgery  TONSILLECTOMY & ADENOIDECTOMY   Allergies  Allergen Reactions  Bacitracin Zinc-Polymyxin B Itching  Band aid reaction  Lisinopril Cough  Sulfa (Sulfonamide Antibiotics) Other (See Comments)  Sumatriptan Succinate Muscle Pain  Penicillins Rash  Sulfasalazine Rash   Current Outpatient Medications on File Prior to Visit  Medication Sig Dispense Refill  aspirin 81 MG EC tablet Take 81 mg by mouth  cetirizine (ZYRTEC) 10 MG tablet Take 10 mg by mouth once daily  cholecalciferol (VITAMIN D3) 1000 unit tablet Take 1,000 Units by mouth once daily  glucosamine HCl/chondroitin su (GLUCOSAMINE-CHONDROITIN ORAL) Take by mouth  hydroCHLOROthiazide  (HYDRODIURIL ) 12.5 MG tablet Take  12.5 mg by mouth once daily  losartan  (COZAAR ) 100 MG tablet Take 100 mg by mouth once daily  meloxicam  (MOBIC ) 7.5 MG tablet Take 1 tablet (7.5 mg total) by mouth daily as needed (pain).  metoprolol  SUCCinate (TOPROL -XL) 100 MG XL tablet Take 100 mg by mouth once daily  omeprazole  (PRILOSEC) 20 MG DR capsule Take 20 mg by mouth once daily  potassium chloride  (KLOR-CON  M20) 20 MEQ ER tablet Take 20 mEq by mouth once daily  vitamin B complex (VITAMINS B COMPLEX ORAL) Take by mouth  .   Family History  Problem Relation Age of Onset  Breast cancer Mother  Stomach cancer Father  Esophageal cancer Father    Social History   Tobacco Use  Smoking Status Never  Smokeless Tobacco Never  Marital status: Unknown  Tobacco Use  Smoking status: Never  Smokeless tobacco: Never  Vaping Use  Vaping status: Never Used  Substance and Sexual Activity  Alcohol use: Never  Drug use: Never   Objective:   Vitals:  04/13/24 1521 04/13/24 1522  BP: (!) 150/79  Pulse: 84  Temp: 36.8 C (98.3 F)  SpO2: 98%  Weight: 72.9 kg (160 lb 12.8 oz)  Height: 165.1 cm (5' 5)    Body mass index is 26.76 kg/m.  Physical Exam Vitals reviewed.  Constitutional:  Appearance: Normal appearance.  Chest:  Breasts: Right: No inverted nipple, mass or nipple discharge.  Left: No inverted nipple, mass or nipple discharge.  Comments: Large right breast hematoma Lymphadenopathy:  Upper Body:  Right upper body: No supraclavicular or axillary adenopathy.  Left upper body: No supraclavicular or axillary adenopathy.  Neurological:  Mental  Status: She is alert.     Assessment and Plan:   Ductal carcinoma in situ (DCIS) of right breast  right breast seed guided lumpectomy  We discussed the staging and pathophysiology of breast cancer. We discussed all of the different options for treatment for breast cancer including surgery, radiation therapy, and antiestrogen therapy.  We discussed the options for  treatment of the breast cancer which included lumpectomy versus a mastectomy. We discussed the performance of the lumpectomy with radioactive seed placement. We discussed a 5-10% chance of a positive margin requiring reexcision in the operating room. We also discussed that she will likely need radiation therapy if she undergoes lumpectomy. We discussed mastectomy and the postoperative care for that as well. Mastectomy can be followed by reconstruction. The decision for lumpectomy vs mastectomy has no impact on decision for chemotherapy. Most mastectomy patients will not need radiation therapy. We discussed that there is no difference in her survival whether she undergoes lumpectomy with radiation therapy or antiestrogen therapy versus a mastectomy. There is also no real difference between her recurrence in the breast.  We discussed the risks of operation including bleeding, infection, possible reoperation. She understands her further therapy will be based on what her stages at the time of her operation.

## 2024-05-08 ENCOUNTER — Other Ambulatory Visit: Payer: Self-pay

## 2024-05-08 ENCOUNTER — Encounter (HOSPITAL_COMMUNITY): Payer: Self-pay | Admitting: General Surgery

## 2024-05-08 ENCOUNTER — Encounter (HOSPITAL_COMMUNITY)
Admission: RE | Disposition: A | Discharge status: Home / Self Care | Payer: Self-pay | Source: Home / Self Care | Attending: General Surgery

## 2024-05-08 ENCOUNTER — Ambulatory Visit (HOSPITAL_COMMUNITY): Payer: Self-pay | Admitting: Physician Assistant

## 2024-05-08 ENCOUNTER — Ambulatory Visit
Admission: RE | Admit: 2024-05-08 | Discharge: 2024-05-08 | Disposition: A | Discharge status: Home / Self Care | Source: Ambulatory Visit | Attending: General Surgery | Admitting: General Surgery

## 2024-05-08 ENCOUNTER — Ambulatory Visit (HOSPITAL_COMMUNITY)
Admission: RE | Admit: 2024-05-08 | Discharge: 2024-05-08 | Disposition: A | Discharge status: Home / Self Care | Attending: General Surgery | Admitting: General Surgery

## 2024-05-08 ENCOUNTER — Ambulatory Visit (HOSPITAL_COMMUNITY)

## 2024-05-08 DIAGNOSIS — Z1721 Progesterone receptor positive status: Secondary | ICD-10-CM | POA: Diagnosis not present

## 2024-05-08 DIAGNOSIS — I4891 Unspecified atrial fibrillation: Secondary | ICD-10-CM | POA: Insufficient documentation

## 2024-05-08 DIAGNOSIS — N6021 Fibroadenosis of right breast: Secondary | ICD-10-CM | POA: Diagnosis not present

## 2024-05-08 DIAGNOSIS — D0511 Intraductal carcinoma in situ of right breast: Secondary | ICD-10-CM | POA: Insufficient documentation

## 2024-05-08 DIAGNOSIS — Z170 Estrogen receptor positive status [ER+]: Secondary | ICD-10-CM | POA: Diagnosis not present

## 2024-05-08 DIAGNOSIS — N6489 Other specified disorders of breast: Secondary | ICD-10-CM | POA: Insufficient documentation

## 2024-05-08 DIAGNOSIS — N62 Hypertrophy of breast: Secondary | ICD-10-CM | POA: Insufficient documentation

## 2024-05-08 DIAGNOSIS — K219 Gastro-esophageal reflux disease without esophagitis: Secondary | ICD-10-CM | POA: Diagnosis not present

## 2024-05-08 DIAGNOSIS — I10 Essential (primary) hypertension: Secondary | ICD-10-CM | POA: Insufficient documentation

## 2024-05-08 DIAGNOSIS — Z79899 Other long term (current) drug therapy: Secondary | ICD-10-CM | POA: Insufficient documentation

## 2024-05-08 DIAGNOSIS — Z803 Family history of malignant neoplasm of breast: Secondary | ICD-10-CM | POA: Diagnosis not present

## 2024-05-08 HISTORY — PX: BREAST LUMPECTOMY WITH RADIOACTIVE SEED LOCALIZATION: SHX6424

## 2024-05-08 SURGERY — BREAST LUMPECTOMY WITH RADIOACTIVE SEED LOCALIZATION
Anesthesia: General | Site: Breast | Laterality: Right

## 2024-05-08 MED ORDER — PHENYLEPHRINE 80 MCG/ML (10ML) SYRINGE FOR IV PUSH (FOR BLOOD PRESSURE SUPPORT)
PREFILLED_SYRINGE | INTRAVENOUS | Status: DC | PRN
  Administered 2024-05-08: 160 ug via INTRAVENOUS
  Administered 2024-05-08: 40 ug via INTRAVENOUS
  Administered 2024-05-08: 80 ug via INTRAVENOUS

## 2024-05-08 MED ORDER — CEFAZOLIN SODIUM-DEXTROSE 2-4 GM/100ML-% IV SOLN
2.0000 g | INTRAVENOUS | Status: AC
  Administered 2024-05-08: 2 g via INTRAVENOUS
  Filled 2024-05-08: qty 100

## 2024-05-08 MED ORDER — LIDOCAINE 2% (20 MG/ML) 5 ML SYRINGE
INTRAMUSCULAR | Status: AC
Start: 2024-05-08 — End: 2024-05-08
  Filled 2024-05-08: qty 5

## 2024-05-08 MED ORDER — ROCURONIUM BROMIDE 10 MG/ML (PF) SYRINGE
PREFILLED_SYRINGE | INTRAVENOUS | Status: AC
Start: 2024-05-08 — End: 2024-05-08
  Filled 2024-05-08: qty 10

## 2024-05-08 MED ORDER — ENSURE PRE-SURGERY PO LIQD: 296.0000 mL | Freq: Once | ORAL | Status: DC

## 2024-05-08 MED ORDER — ONDANSETRON HCL 4 MG/2ML IJ SOLN: 4.0000 mg | Freq: Once | INTRAMUSCULAR | Status: DC | PRN

## 2024-05-08 MED ORDER — OXYCODONE HCL 5 MG PO TABS: 5.0000 mg | ORAL_TABLET | Freq: Once | ORAL | Status: DC | PRN

## 2024-05-08 MED ORDER — EPHEDRINE SULFATE-NACL 50-0.9 MG/10ML-% IV SOSY
PREFILLED_SYRINGE | INTRAVENOUS | Status: DC | PRN
  Administered 2024-05-08: 10 mg via INTRAVENOUS
  Administered 2024-05-08 (×2): 5 mg via INTRAVENOUS

## 2024-05-08 MED ORDER — BUPIVACAINE-EPINEPHRINE 0.25% -1:200000 IJ SOLN
INTRAMUSCULAR | Status: DC | PRN
  Administered 2024-05-08: 10 mL

## 2024-05-08 MED ORDER — FENTANYL CITRATE (PF) 250 MCG/5ML IJ SOLN
INTRAMUSCULAR | Status: AC
  Filled 2024-05-08: qty 5

## 2024-05-08 MED ORDER — ONDANSETRON HCL 4 MG/2ML IJ SOLN
INTRAMUSCULAR | Status: DC | PRN
  Administered 2024-05-08: 4 mg via INTRAVENOUS

## 2024-05-08 MED ORDER — METOPROLOL SUCCINATE ER 25 MG PO TB24
100.0000 mg | ORAL_TABLET | Freq: Once | ORAL | Status: AC
  Administered 2024-05-08: 100 mg via ORAL

## 2024-05-08 MED ORDER — DEXAMETHASONE SODIUM PHOSPHATE 10 MG/ML IJ SOLN
INTRAMUSCULAR | Status: AC
  Filled 2024-05-08: qty 1

## 2024-05-08 MED ORDER — DEXAMETHASONE SODIUM PHOSPHATE 10 MG/ML IJ SOLN
INTRAMUSCULAR | Status: DC | PRN
  Administered 2024-05-08: 5 mg via INTRAVENOUS

## 2024-05-08 MED ORDER — CHLORHEXIDINE GLUCONATE CLOTH 2 % EX PADS: 6.0000 | MEDICATED_PAD | Freq: Once | CUTANEOUS | Status: DC

## 2024-05-08 MED ORDER — OXYCODONE HCL 5 MG/5ML PO SOLN: 5.0000 mg | Freq: Once | ORAL | Status: DC | PRN

## 2024-05-08 MED ORDER — ONDANSETRON HCL 4 MG/2ML IJ SOLN
INTRAMUSCULAR | Status: AC
  Filled 2024-05-08: qty 2

## 2024-05-08 MED ORDER — 0.9 % SODIUM CHLORIDE (POUR BTL) OPTIME
TOPICAL | Status: DC | PRN
Start: 2024-05-08 — End: 2024-05-08
  Administered 2024-05-08: 1000 mL

## 2024-05-08 MED ORDER — PROPOFOL 10 MG/ML IV BOLUS
INTRAVENOUS | Status: AC
  Filled 2024-05-08: qty 20

## 2024-05-08 MED ORDER — LACTATED RINGERS IV SOLN: INTRAVENOUS | Status: DC

## 2024-05-08 MED ORDER — ORAL CARE MOUTH RINSE: 15.0000 mL | Freq: Once | OROMUCOSAL | Status: AC

## 2024-05-08 MED ORDER — ACETAMINOPHEN 500 MG PO TABS
1000.0000 mg | ORAL_TABLET | ORAL | Status: AC
  Administered 2024-05-08: 1000 mg via ORAL
  Filled 2024-05-08: qty 2

## 2024-05-08 MED ORDER — BUPIVACAINE-EPINEPHRINE (PF) 0.25% -1:200000 IJ SOLN
INTRAMUSCULAR | Status: AC
  Filled 2024-05-08: qty 30

## 2024-05-08 MED ORDER — CHLORHEXIDINE GLUCONATE 0.12 % MT SOLN
15.0000 mL | Freq: Once | OROMUCOSAL | Status: AC
  Administered 2024-05-08: 15 mL via OROMUCOSAL
  Filled 2024-05-08: qty 15

## 2024-05-08 MED ORDER — FENTANYL CITRATE (PF) 100 MCG/2ML IJ SOLN: 25.0000 ug | INTRAMUSCULAR | Status: DC | PRN

## 2024-05-08 MED ORDER — PROPOFOL 10 MG/ML IV BOLUS
INTRAVENOUS | Status: DC | PRN
  Administered 2024-05-08: 150 mg via INTRAVENOUS

## 2024-05-08 MED ORDER — LIDOCAINE 2% (20 MG/ML) 5 ML SYRINGE
INTRAMUSCULAR | Status: DC | PRN
  Administered 2024-05-08: 100 mg via INTRAVENOUS

## 2024-05-08 MED ORDER — METOPROLOL SUCCINATE ER 25 MG PO TB24
ORAL_TABLET | ORAL | Status: DC
Start: 2024-05-08 — End: 2024-05-08
  Filled 2024-05-08: qty 4

## 2024-05-08 MED ORDER — FENTANYL CITRATE (PF) 250 MCG/5ML IJ SOLN
INTRAMUSCULAR | Status: DC | PRN
  Administered 2024-05-08: 25 ug via INTRAVENOUS
  Administered 2024-05-08: 50 ug via INTRAVENOUS

## 2024-05-08 SURGICAL SUPPLY — 33 items
BAG COUNTER SPONGE SURGICOUNT (BAG) ×1 IMPLANT
BINDER BREAST LRG (GAUZE/BANDAGES/DRESSINGS) IMPLANT
BINDER BREAST XLRG (GAUZE/BANDAGES/DRESSINGS) IMPLANT
CANISTER SUCTION 3000ML PPV (SUCTIONS) ×1 IMPLANT
CHLORAPREP W/TINT 26 (MISCELLANEOUS) ×1 IMPLANT
CLIP APPLIE 9.375 MED OPEN (MISCELLANEOUS) IMPLANT
CLIP TI MEDIUM 6 (CLIP) IMPLANT
COVER PROBE W GEL 5X96 (DRAPES) ×1 IMPLANT
COVER SURGICAL LIGHT HANDLE (MISCELLANEOUS) ×1 IMPLANT
DERMABOND ADVANCED .7 DNX12 (GAUZE/BANDAGES/DRESSINGS) ×1 IMPLANT
DEVICE DUBIN SPECIMEN MAMMOGRA (MISCELLANEOUS) ×1 IMPLANT
DRAPE CHEST BREAST 15X10 FENES (DRAPES) ×1 IMPLANT
ELECT COATED BLADE 2.86 ST (ELECTRODE) ×1 IMPLANT
ELECTRODE REM PT RTRN 9FT ADLT (ELECTROSURGICAL) ×1 IMPLANT
GLOVE BIO SURGEON STRL SZ7 (GLOVE) ×2 IMPLANT
GLOVE BIOGEL PI IND STRL 7.5 (GLOVE) ×1 IMPLANT
GOWN STRL REUS W/ TWL LRG LVL3 (GOWN DISPOSABLE) ×2 IMPLANT
KIT BASIN OR (CUSTOM PROCEDURE TRAY) ×1 IMPLANT
KIT MARKER MARGIN INK (KITS) ×1 IMPLANT
LIGHT WAVEGUIDE WIDE FLAT (MISCELLANEOUS) IMPLANT
NDL HYPO 25GX1X1/2 BEV (NEEDLE) ×1 IMPLANT
NEEDLE HYPO 25GX1X1/2 BEV (NEEDLE) ×1 IMPLANT
NS IRRIG 1000ML POUR BTL (IV SOLUTION) ×1 IMPLANT
PACK GENERAL/GYN (CUSTOM PROCEDURE TRAY) ×1 IMPLANT
STRIP CLOSURE SKIN 1/2X4 (GAUZE/BANDAGES/DRESSINGS) ×1 IMPLANT
SUT MNCRL AB 4-0 PS2 18 (SUTURE) ×1 IMPLANT
SUT MON AB 5-0 PS2 18 (SUTURE) IMPLANT
SUT SILK 2 0 SH (SUTURE) IMPLANT
SUT VIC AB 2-0 SH 27XBRD (SUTURE) ×1 IMPLANT
SUT VIC AB 3-0 SH 27X BRD (SUTURE) ×1 IMPLANT
SYR CONTROL 10ML LL (SYRINGE) ×1 IMPLANT
TOWEL GREEN STERILE (TOWEL DISPOSABLE) ×1 IMPLANT
TOWEL GREEN STERILE FF (TOWEL DISPOSABLE) ×1 IMPLANT

## 2024-05-08 NOTE — Anesthesia Procedure Notes (Signed)
 Procedure Name: LMA Insertion Date/Time: 05/08/2024 9:44 AM  Performed by: Jerrye Herring, CRNAPre-anesthesia Checklist: Patient identified, Emergency Drugs available, Suction available and Patient being monitored Patient Re-evaluated:Patient Re-evaluated prior to induction Oxygen Delivery Method: Circle System Utilized Preoxygenation: Pre-oxygenation with 100% oxygen Induction Type: IV induction Ventilation: Mask ventilation without difficulty LMA: LMA inserted LMA Size: 4.0 Number of attempts: 1 Airway Equipment and Method: Bite block Placement Confirmation: positive ETCO2 Tube secured with: Tape Dental Injury: Teeth and Oropharynx as per pre-operative assessment  Comments: Atraumatic Insertion

## 2024-05-08 NOTE — Transfer of Care (Signed)
 Immediate Anesthesia Transfer of Care Note  Patient: Rhonda Bell  Procedure(s) Performed: BREAST LUMPECTOMY WITH RADIOACTIVE SEED LOCALIZATION (Right: Breast)  Patient Location: PACU  Anesthesia Type:General  Level of Consciousness: awake  Airway & Oxygen Therapy: Patient Spontanous Breathing and Patient connected to face mask oxygen  Post-op Assessment: Report given to RN, Patient moving all extremities, and Patient moving all extremities X 4  Post vital signs: Reviewed  Last Vitals:  Vitals Value Taken Time  BP 147/50 05/08/24 10:30  Temp    Pulse 76 05/08/24 10:34  Resp 22 05/08/24 10:34  SpO2 100 % 05/08/24 10:34  Vitals shown include unfiled device data.  Last Pain:  Vitals:   05/08/24 0805  TempSrc:   PainSc: 0-No pain         Complications: There were no known notable events for this encounter.

## 2024-05-08 NOTE — Discharge Instructions (Signed)
 [facility] Office Phone Number [phone]  POST OP INSTRUCTIONS Take 400 mg of ibuprofen every 8 hours or 650 mg tylenol  every 6 hours for next 72 hours then as needed. Use ice several times daily also.  A prescription for pain medication may be given to you upon discharge.  Take your pain medication as prescribed, if needed.  If narcotic pain medicine is not needed, then you may take acetaminophen  (Tylenol ), naprosyn (Alleve) or ibuprofen (Advil) as needed. Take your usually prescribed medications unless otherwise directed If you need a refill on your pain medication, please contact your pharmacy.  They will contact our office to request authorization.  Prescriptions will not be filled after 5pm or on week-ends. You should eat very light the first 24 hours after surgery, such as soup, crackers, pudding, etc.  Resume your normal diet the day after surgery. Most patients will experience some swelling and bruising in the breast.  Ice packs and a good support bra will help.  Wear the breast binder provided or a sports bra for 72 hours day and night.  After that wear a sports bra during the day until you return to the office. Swelling and bruising can take several days to resolve.  It is common to experience some constipation if taking pain medication after surgery.  Increasing fluid intake and taking a stool softener will usually help or prevent this problem from occurring.  A mild laxative (Milk of Magnesia or Miralax) should be taken according to package directions if there are no bowel movements after 48 hours. I used skin glue on the incision, you may shower in 24 hours.  The glue will flake off over the next 2-3 weeks as will the steristrips.   Any sutures or staples will be removed at the office during your follow-up visit. ACTIVITIES:  You may resume regular daily activities (gradually increasing) beginning the next day.  Wearing a good support bra or sports bra minimizes pain and  swelling.  You may have sexual intercourse when it is comfortable. You may drive when you no longer are taking prescription pain medication, you can comfortably wear a seatbelt, and you can safely maneuver your car and apply brakes. RETURN TO WORK:  ______________________________________________________________________________________ Rhonda Bell should see your doctor in the office for a follow-up appointment approximately two to three weeks after your surgery.  Your doctor's nurse will typically make your follow-up appointment when she calls you with your pathology report.  Expect your pathology report 3-4 business days after your surgery.  You may call to check if you do not hear from us  after three days.  WHEN TO CALL DR Taniela Feltus: Fever over 101.0 Nausea and/or vomiting. Extreme swelling or bruising. Continued bleeding from incision. Increased pain, redness, or drainage from the incision.  The clinic staff is available to answer your questions during regular business hours.  Please don't hesitate to call and ask to speak to one of the nurses for clinical concerns.  If you have a medical emergency, go to the nearest emergency room or call 911.  A surgeon from [facility] is always on call at the hospital.  For further questions, please visit [email or web address]

## 2024-05-08 NOTE — Interval H&P Note (Signed)
 History and Physical Interval Note:  05/08/2024 9:05 AM  Rhonda Bell  has presented today for surgery, with the diagnosis of DUCTAL CARCINOMA IN SITU RIGHT BREAST.  The various methods of treatment have been discussed with the patient and family. After consideration of risks, benefits and other options for treatment, the patient has consented to  Procedure(s) with comments: BREAST LUMPECTOMY WITH RADIOACTIVE SEED LOCALIZATION (Right) - LMA RIGHT BREAST SEED GUIDED LUMPECTOMY as a surgical intervention.  The patient's history has been reviewed, patient examined, no change in status, stable for surgery.  I have reviewed the patient's chart and labs.  Questions were answered to the patient's satisfaction.     Donnice Bury

## 2024-05-08 NOTE — Op Note (Addendum)
 Preoperative diagnosis: Right breast ductal carcinoma in situ Postoperative diagnosis: Same as above Procedure: Right breast radioactive seed guided lumpectomy Surgeon: Dr. Adina Bury Anesthesia: General Estimated blood loss: Minimal Specimens: RIght breast tissue containing seed marked with paint containing seed and clip Complications: None Drains: None Sponge needle count was correct completion Disposition recovery in stable condition   Indications: 78 year old female who underwent a screening mammogram at an outside facility.  She has heterogeneously dense breasts. She has an indeterminate asymmetry in the central posterior right breast. The left breast is stable. She underwent a biopsy with a ribbon clip placement. 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 She is here today to discuss her options. She had a very large hematoma after a biopsy that is slowly resolving. She has also been previously diagnosed with atrial fibrillation.  She has been seen by cardiology, now has a seed placed.  She still has significant hematoma  Procedure: After informed consent was obtained she first had a seed placed.  I these mammograms available for my review.   She was given antibiotics.  SCDs were placed.  She was placed under general anesthesia without complication.  She was prepped and draped in standard sterile surgical fashion.  A surgical timeout was then performed.   I infiltrated Marcaine  throughout the lateral right breast.  I then made a curvilinear incision overlying the seed as it felt close to the skin.  There was still a fairly large organized hematoma cavity present.   I dissected to the seed.  I removed the seed and some of the hematoma cavity. and some of the surrounding tissue in that same to get a clear margin.  Mammogram confirmed removal of the seed and clip. It looked like seed had migrated a little in  hematoma but clip was in center of specimen.  I thought my margins were clear at that point.  I then obtained hemostasis.  I closed the breast tissue with 2-0 Vicryl.  The skin was closed with 3-0 Vicryl and 4-0 Monocryl.  Glue and Steri-Strips were applied.  She tolerated this well was extubated and transferred to recovery stable.

## 2024-05-08 NOTE — Anesthesia Postprocedure Evaluation (Signed)
 Anesthesia Post Note  Patient: Rhonda Bell  Procedure(s) Performed: BREAST LUMPECTOMY WITH RADIOACTIVE SEED LOCALIZATION (Right: Breast)     Patient location during evaluation: PACU Anesthesia Type: General Level of consciousness: awake and alert Pain management: pain level controlled Vital Signs Assessment: post-procedure vital signs reviewed and stable Respiratory status: spontaneous breathing, nonlabored ventilation, respiratory function stable and patient connected to nasal cannula oxygen Cardiovascular status: blood pressure returned to baseline and stable Postop Assessment: no apparent nausea or vomiting Anesthetic complications: no   There were no known notable events for this encounter.  Last Vitals:  Vitals:   05/08/24 1045 05/08/24 1100  BP: 139/62 138/71  Pulse: 79 74  Resp: (!) 28 (!) 34  Temp:  (!) 36.3 C  SpO2: 97% 99%    Last Pain:  Vitals:   05/08/24 1100  TempSrc:   PainSc: Asleep                 Rome Ade

## 2024-05-09 ENCOUNTER — Encounter (HOSPITAL_COMMUNITY): Payer: Self-pay | Admitting: General Surgery

## 2024-05-10 LAB — SURGICAL PATHOLOGY

## 2024-05-11 ENCOUNTER — Inpatient Hospital Stay
Admission: RE | Admit: 2024-05-11 | Discharge: 2024-05-11 | Disposition: A | Discharge status: Home / Self Care | Payer: Self-pay | Source: Ambulatory Visit | Attending: Radiation Oncology | Admitting: Radiation Oncology

## 2024-05-11 ENCOUNTER — Other Ambulatory Visit: Payer: Self-pay | Admitting: Radiation Oncology

## 2024-05-11 DIAGNOSIS — D0511 Intraductal carcinoma in situ of right breast: Secondary | ICD-10-CM

## 2024-05-15 ENCOUNTER — Other Ambulatory Visit: Payer: Self-pay | Admitting: General Surgery

## 2024-05-15 NOTE — Progress Notes (Incomplete)
 SABRA

## 2024-05-15 NOTE — Progress Notes (Signed)
 Subjective Patient ID: Rhonda Bell is a 78 y.o. female.   HPI  Patient of Drs. [name] and Iruku referred for consultation breast reconstruction. Presented following screening MMG with right breast asymmetry. Biopsy demonstrated intermediate grade DCIS ER/PR+.  She has undergone lumpectomy a week ago with pathology extensive DCIS intermediate to high grade with all margins positive except inferior. No invasive carcinoma present. She has met with Dr. Dr [name] and they discussed options including mastectomy, repeat lumpectomy, repeat lumpectomy with oncoplastic reconstruction. Patient prefers latter.  Current- not sure size. No weight recorded on lumpectomy.  PMH significant for atrial fibrillation. Anticoagulation recommended; patient has declined. She had pre op clearance visit with Cardiology this month. ECHO ordered, not completed prior to lumpectomy. She is on ASA 81 mg and Cardiology recommended continuing this through perioperative period.  Lives alone. Daughters in area to assist with post op care. 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   Review of Systems  Musculoskeletal:  Positive for arthralgias and joint swelling.  Allergic/Immunologic: Positive for environmental allergies.  All other systems reviewed and are negative.   Objective Physical Exam  Cardiovascular: Normal rate.   Pulmonary/Chest Effort normal.   Psychiatric She has a normal mood and affect.    Lymph: no palpable axillary adenopathy  Breasts: no palpable masses, grade 3 ptosis bilateral Right lateral breast curvilinear scar, hematoma present and ecchymoses over lower pole breast SN to nipple R 33 L 33.5 BW R 23 L 23 cm Nipple to IMF R 10 L 10 cm  Assessment/Plan Breast neoplasm, Tis (DCIS), right s/p lumpectomy (positive margins)  Appt with Rad Onc scheduled for tomorrow- recommend she call and see if they want to defer this appt for  now.  Discussed oncoplastic reconstruction 7-10 d post lumpectomy to ensure pathologic clearance. Reviewed reduction with anchor type scars, drains, post operative visits and limitations, recovery. Diminished sensation nipple and breast skin, risk of nipple loss, wound healing problems, asymmetry. Discussed will have some contraction of breast volume and increased firmness with radiation, less ptosis with aging. This can result in asymmetries long term. Discussed changes with wt gain, loss, aging. Discussed lumpectomy alone can result in NAC displacement, distortion contour breast following lumpectomy and RT, asymmetry breast volume and NAC position. Reviewed purpose of this type reconstruction to prevent these. Reviewed breast lift or trying to correct NAC displacement post RT more difficult. Counseled I cannot assure her cup size.  Reviewed can defer surgery until after therapies complete. In this setting would wait at least 6 months from end RT for any surgery. Reviewed increased risks complications in setting RT. Reviewed any complications from oncoplastic reconstruction procedure may delay start adjuvant therapies. We discussed the lumpectomy incision will likely not be removed with reduction surgery and additional scars from this reviewed.   Pictures taken. Desires to proceed.     I have personally spent 60 minutes involved in face-to-face and non-face-to-face activities for this patient on the day of the visit.  Professional time spent includes the following activities, in addition to those noted in the documentation: record review documentation coordination care

## 2024-05-15 NOTE — Progress Notes (Signed)
 Location of Breast Cancer: Ductal Carcinoma in Situ of Right Breast   Histology per Pathology Report:    Receptor Status: ER(90%), PR (40%)  Did patient present with symptoms (if so, please note symptoms) or was this found on screening mammography?:  Mammogram  Past/Anticipated interventions by surgeon, if any: 05/08/2024 [name], MD Breast Lumpectomy with Radioactive Seed Localization Past/Anticipated interventions by medical oncology, if any: Chemotherapy ***  Lymphedema issues, if any:  {:18581} {t:21944}   Pain issues, if any:  {:18581} {PAIN DESCRIPTION:21022940}  SAFETY ISSUES: Prior radiation? {:18581} Pacemaker/ICD? {:18581} Possible current pregnancy?{:18581} Is the patient on methotrexate? {:18581}  Current Complaints / other details:  ***

## 2024-05-16 ENCOUNTER — Ambulatory Visit

## 2024-05-16 ENCOUNTER — Ambulatory Visit
Admission: RE | Admit: 2024-05-16 | Discharge: 2024-05-16 | Disposition: A | Discharge status: Home / Self Care | Source: Ambulatory Visit | Attending: Radiation Oncology | Admitting: Radiation Oncology

## 2024-05-16 ENCOUNTER — Ambulatory Visit
Admission: RE | Admit: 2024-05-16 | Discharge: 2024-05-16 | Disposition: A | Discharge status: Home / Self Care | Source: Ambulatory Visit | Attending: Radiation Oncology

## 2024-05-16 ENCOUNTER — Telehealth: Payer: Self-pay | Admitting: Radiation Oncology

## 2024-05-16 DIAGNOSIS — Z791 Long term (current) use of non-steroidal anti-inflammatories (NSAID): Secondary | ICD-10-CM | POA: Insufficient documentation

## 2024-05-16 DIAGNOSIS — Z7982 Long term (current) use of aspirin: Secondary | ICD-10-CM | POA: Diagnosis not present

## 2024-05-16 DIAGNOSIS — M129 Arthropathy, unspecified: Secondary | ICD-10-CM | POA: Insufficient documentation

## 2024-05-16 DIAGNOSIS — Z1721 Progesterone receptor positive status: Secondary | ICD-10-CM | POA: Insufficient documentation

## 2024-05-16 DIAGNOSIS — Z170 Estrogen receptor positive status [ER+]: Secondary | ICD-10-CM | POA: Insufficient documentation

## 2024-05-16 DIAGNOSIS — Z79899 Other long term (current) drug therapy: Secondary | ICD-10-CM | POA: Diagnosis not present

## 2024-05-16 DIAGNOSIS — Z803 Family history of malignant neoplasm of breast: Secondary | ICD-10-CM | POA: Insufficient documentation

## 2024-05-16 DIAGNOSIS — D0511 Intraductal carcinoma in situ of right breast: Secondary | ICD-10-CM | POA: Diagnosis present

## 2024-05-16 DIAGNOSIS — K219 Gastro-esophageal reflux disease without esophagitis: Secondary | ICD-10-CM | POA: Diagnosis not present

## 2024-05-16 DIAGNOSIS — I10 Essential (primary) hypertension: Secondary | ICD-10-CM | POA: Insufficient documentation

## 2024-05-16 NOTE — Telephone Encounter (Signed)
 Received message via [employer] about pt needing to cancel appt. Called pt to get more detail. Pt advised she met with Dr. [name] last week and was advised more surgery will be needed due to margins. She advised his team would let us  know, no message ever received from his office. Appts cx and referral closed at this time. Dr. [name] will notify of r/s date to plan accordingly. Pt in agreement. Appts cx, referral closed, message routed to [facility] team.

## 2024-05-16 NOTE — Progress Notes (Signed)
 Radiation Oncology         [phone] ________________________________  Initial outpatient Consultation by telephone.  The patient opted for telemedicine today.  MyChart video was not obtainable.   Name: Rhonda Bell MRN: [id number]  Date: 05/16/2024  DOB: [birth year]  RR:Gnyw, Nohemi Shuck, MD  [name] Cough, MD   REFERRING PHYSICIAN: [name] Cough, MD  DIAGNOSIS:    ICD-10-CM   1. Ductal carcinoma in situ (DCIS) of right breast  D05.11      High-grade right breast DCIS, ER+ / PR+ / Her2 not applicable  CHIEF COMPLAINT: Here to discuss management of right breast DCIS  HISTORY OF PRESENT ILLNESS::Rhonda Bell is a 78 y.o. female who presented with breast abnormality on the following imaging: bilateral diagnostic mammogram on the date of 02/23/24. No symptoms, if any, at that time, were reported. Ultrasound of breast revealed heterogeneously dense breasts with an indeterminate asymmetry in the central posterior right breast. No abnormalities in the left breast.   Subsequently, she underwent a right breast stereotactic core needle biopsy on date of 03/14/24 showed intermediate grade DCIS with cribriform and papillary architecture. ER status: 90% positive with strong staining intensity; PR status  40% positive with strong staining intensity, Her2 status not applicable. Immunohistochemical stains were performed on blocks A2, A3 and A5 showing no evidence of invasive carcinoma.   In light of findings, she was then referred to Dr. [name] on 04/13/24 to discuss further treatment plan. Upon discussion, patient opted to proceed with a right breast lumpectomy with radioactive seed localization. Surgical pathology indicated tumor size of 4.9 x 4.3 x 3.5 cm and histology of intermediate to high nuclear grade DCIS papillary, cribriform and clinging types with abundant luminal mucin and focal necrosis present. DCIS positive in lateral, medial, superior, anterior and posterior margins with  closest margin 3 mm from inferior margin. ER status: 90% positive; PR status  40% positive, Her2 status not applicable. Negative for invasive carcinoma.       During her most recent post-op visit with Dr. [name] on 05/12/24, to discuss her positive margins and next steps. They discussed undergoing a mastectomy with or without reconstruction, or a more significant lumpectomy combined with a staged bilateral oncoplastic reduction with plastic surgery. Patient will proceed with an operation after discussing options with plastic surgery.      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  PATH DETAILS from 05-08-24: FINAL MICROSCOPIC DIAGNOSIS:  A. RIGHT BREAST, LUMPECTOMY: Extensive ductal carcinoma in situ, intermediate to high nuclear grade, papillary, cribriform and clinging types with abundant luminal mucin and focal necrosis Negative for invasive carcinoma DCIS measures 4.9 x 4.3 x 3.5 cm (involves 18 of 19 submitted blocks) DCIS present in lateral, medial, superior, anterior and posterior margins (3 mm from inferior margin) Prognostic markers: Estrogen receptor positive, progesterone receptor positive (by report) Changes consistent with prior biopsy (ribbon clip) Fibrocystic changes including stromal fibrosis, cystic dilatation of ducts, adenosis, columnar cell change and usual duct hyperplasia Microcalcifications present within benign ducts  ONCOLOGY TABLE:  DCIS OF THE BREAST:  Resection  Procedure: Lumpectomy Specimen Laterality: Right Histologic Type: Ductal carcinoma in situ, clinging, papillary and cribriform types with abundant luminal mucin and focal necrosis Size of DCIS: 4.9 x 4.3 x 3.5 cm (involves 18 of 19 submitted blocks) Nuclear Grade: High-grade Necrosis: Present Margins: DCIS present in lateral, medial, superior and posterior margins  Specify Closest  Margin (required only if <23mm): 3 mm from inferior margin Regional Lymph Nodes: Not applicable (no lymph nodes submitted or found) Breast Biomarker Testing Performed on Previous Biopsy:      Testing performed on Case Number: By report (from Op note)      Estrogen Receptor: Positive (greater than 90%) (may be repeated upon request)      Progesterone Receptor: Positive (greater than 40%) (may be repeated upon request) Pathologic Stage Classification (pTNM, AJCC 8th Edition): pTis, pN n/a   PREVIOUS RADIATION THERAPY: No  PAST MEDICAL HISTORY:  has a past medical history of Arthritis, Dysrhythmia, GERD (gastroesophageal reflux disease), and Hypertension.    PAST SURGICAL HISTORY: Past Surgical History:  Procedure Laterality Date   BREAST BIOPSY  05/05/2024   MM RT RADIOACTIVE SEED LOC MAMMO GUIDE 05/05/2024 [facility] MAMMOGRAPHY   BREAST LUMPECTOMY Left    BREAST LUMPECTOMY WITH RADIOACTIVE SEED LOCALIZATION Right 05/08/2024   Procedure: BREAST LUMPECTOMY WITH RADIOACTIVE SEED LOCALIZATION;  Surgeon: [name] Cough, MD;  Location: [facility] OR;  Service: General;  Laterality: Right;  LMA RIGHT BREAST SEED GUIDED LUMPECTOMY   THYROIDECTOMY, PARTIAL     TONSILLECTOMY      FAMILY HISTORY: She reports breast cancer in her mother (70s) and her sister (34s).  SOCIAL HISTORY:  reports that she has never smoked. She has never used smokeless tobacco. She reports that she does not drink alcohol and does not use drugs.  ALLERGIES: Penicillins and Sulfa antibiotics  MEDICATIONS:  Current Outpatient Medications  Medication Sig Dispense Refill   aspirin EC 81 MG tablet Take 81 mg by mouth daily with lunch.     b complex vitamins capsule Take 1 capsule by mouth daily with lunch.     cetirizine (ZYRTEC) 10 MG tablet Take 10 mg by mouth in the morning.     hydrochlorothiazide  (HYDRODIURIL ) 12.5 MG tablet Take 1 tablet (12.5 mg total) by mouth daily. 90 tablet 3   meloxicam  (MOBIC ) 7.5 MG tablet Take 1  tablet (7.5 mg total) by mouth daily as needed for pain. (Patient taking differently: Take 7.5 mg by mouth daily with lunch.) 90 tablet 0   metoprolol  succinate (TOPROL -XL) 100 MG 24 hr tablet Take 1 tablet (100 mg total) by mouth daily. (Patient taking differently: Take 100 mg by mouth daily with lunch.) 90 tablet 0   Misc Natural Products (OSTEO BI-FLEX TRIPLE STRENGTH) TABS Take 1 tablet by mouth daily with lunch.     omeprazole  (PRILOSEC) 20 MG capsule Take 1 capsule (20 mg total) by mouth daily. 90 capsule 0   potassium chloride  SA (KLOR-CON  M) 20 MEQ tablet Take 1 tablet (20 mEq total) by mouth daily. (Patient taking differently: Take 20 mEq by mouth daily with lunch.) 90 tablet 2   telmisartan  (MICARDIS ) 80 MG tablet Take 1 tablet (80 mg total) by mouth daily. 90 tablet 1   Vitamin D-Vitamin K (D3 + K2 PO) Take 1 tablet by mouth daily with lunch.     No current facility-administered medications for this encounter.    REVIEW OF SYSTEMS: As above in HPI.   PHYSICAL EXAM:  vitals were not taken for this visit.   General: Alert and oriented, in no acute distress    LABORATORY DATA:   CBC    Component Value Date/Time   WBC 6.5 05/02/2024 1030   RBC 4.29 05/02/2024 1030   HGB 12.8 05/02/2024 1030   HCT 39.4 05/02/2024 1030   PLT 199 05/02/2024 1030   MCV  91.8 05/02/2024 1030   MCH 29.8 05/02/2024 1030   MCHC 32.5 05/02/2024 1030   RDW 13.1 05/02/2024 1030    CMP     Component Value Date/Time   NA 144 04/28/2024 1046   K 3.6 04/28/2024 1046   CL 101 04/28/2024 1046   CO2 22 04/28/2024 1046   GLUCOSE 88 04/28/2024 1046   BUN 18 04/28/2024 1046   CREATININE 0.83 04/28/2024 1046   CALCIUM 9.2 04/28/2024 1046   EGFR 73 04/28/2024 1046      RADIOGRAPHY: MM Breast Surgical Specimen Result Date: 05/08/2024 CLINICAL DATA:  Evaluate surgical specimen following lumpectomy for RIGHT breast DCIS. EXAM: SPECIMEN RADIOGRAPH OF THE RIGHT BREAST COMPARISON:  Previous exam(s).  FINDINGS: Status post excision of the RIGHT breast. The radioactive seed and RIBBON biopsy clip are present and intact. IMPRESSION: Specimen radiograph of the RIGHT breast. Electronically Signed   By: Reyes Phi M.D.   On: 05/08/2024 14:02   MM RT RADIOACTIVE SEED LOC MAMMO GUIDE Result Date: 05/05/2024 CLINICAL DATA:  Patient presents for radioactive seed localization right breast DCIS prior to surgical excision. EXAM: MAMMOGRAPHIC GUIDED RADIOACTIVE SEED LOCALIZATION OF THE RIGHT BREAST COMPARISON:  Previous exam(s). FINDINGS: Patient presents for radioactive seed localization prior to surgical excision. I met with the patient and we discussed the procedure of seed localization including benefits and alternatives. We discussed the high likelihood of a successful procedure. We discussed the risks of the procedure including infection, bleeding, tissue injury and further surgery. We discussed the low dose of radioactivity involved in the procedure. Informed, written consent was given. The usual time-out protocol was performed immediately prior to the procedure. Using mammographic guidance, sterile technique, 1% lidocaine  and an I-125 radioactive seed, the ribbon shaped post biopsy marker clip was localized using a lateral approach. The follow-up mammogram images confirm the seed in the expected location and were marked for Dr. [name]. Follow-up survey of the patient confirms presence of the radioactive seed. Order number of I-125 seed:  [id number]. Total activity:  0.249 millicuries.  Reference Date: 03/29/2024. The patient tolerated the procedure well and was [facility] from the [facility]. She was given instructions regarding seed removal. IMPRESSION: Radioactive seed localization of the right breast. No apparent complications. Electronically Signed   By: Alm Parkins M.D.   On: 05/05/2024 10:31      IMPRESSION/PLAN: Ductal carcinoma in situ (DCIS) of right breast   This is a very pleasant patient with  right breast DCIS, positive margins after lumpectomy.  She anticipates another lumpectomy to clear her margins and possible oncoplastic reconstruction.  She is therefore not quite ready for adjuvant therapy but it was helpful for us  to meet today so we could discuss what to expect after her surgery is complete.  I recommend adjuvant radiation therapy to the right breast to reduce her risk of locoregional recurrence. We spoke about acute effects including skin irritation and fatigue as well as much less common late effects including internal organ injury or irritation. We spoke about the latest technology that is used to minimize the risk of late effects for patients undergoing radiotherapy to the breast or chest wall. No guarantees of treatment were given. The patient is enthusiastic about proceeding with treatment. I look forward to participating in the patient's care.  I will await her referral back to me for postoperative follow-up and eventual CT simulation/treatment planning.  Given her family history, we discussed the option of genetic counseling. She would like to hold off on that  for now.  We also discussed that if her margins cannot be cleared at her next surgery she may need a mastectomy.  If she undergoes mastectomy it is unlikely that she will benefit from postoperative radiation.  She expressed gratitude for our consultation today and I wished her the best with her upcoming appointments.  This encounter was provided by telemedicine platform; patient desired telemedicine during pandemic precautions.  MyChart video was not available and therefore telephone was used. The patient has given verbal consent for this type of encounter and has been advised to only accept a meeting of this type in a secure network environment. On date of service, in total, I spent 35 minutes on this encounter.   The attendants for this meeting include Lauraine Golden  and Rudolph Aran During the encounter, Lauraine Golden  was located at [facility] Radiation Oncology Department.  Angelice Piech was located at home.      __________________________________________   Lauraine Golden, MD  This document serves as a record of services personally performed by Lauraine Golden, MD. It was created on her behalf by Reymundo Cartwright, a trained medical scribe. The creation of this record is based on the scribe's personal observations and the provider's statements to them. This document has been checked and approved by the attending provider.

## 2024-05-18 ENCOUNTER — Telehealth: Payer: Self-pay

## 2024-05-18 ENCOUNTER — Telehealth: Payer: Self-pay | Admitting: *Deleted

## 2024-05-18 NOTE — Telephone Encounter (Signed)
 Pt will not be coming to their appt.

## 2024-05-18 NOTE — Telephone Encounter (Signed)
 Received message from Dr. Gelene that patient is overwhelmed at the time and wants to cancel her appt with Dr. Loretha for 8/1.

## 2024-05-19 ENCOUNTER — Ambulatory Visit: Admitting: Hematology and Oncology

## 2024-05-19 ENCOUNTER — Other Ambulatory Visit

## 2024-05-22 ENCOUNTER — Other Ambulatory Visit (HOSPITAL_COMMUNITY): Payer: Self-pay

## 2024-05-22 ENCOUNTER — Other Ambulatory Visit (HOSPITAL_BASED_OUTPATIENT_CLINIC_OR_DEPARTMENT_OTHER): Payer: Self-pay

## 2024-05-22 MED ORDER — METOPROLOL SUCCINATE ER 100 MG PO TB24
100.0000 mg | ORAL_TABLET | Freq: Every day | ORAL | 0 refills | Status: DC
  Filled 2024-05-22: qty 90, 90d supply, fill #0

## 2024-06-05 ENCOUNTER — Encounter (HOSPITAL_COMMUNITY): Payer: Self-pay | Admitting: General Surgery

## 2024-06-05 ENCOUNTER — Other Ambulatory Visit: Payer: Self-pay

## 2024-06-05 NOTE — Progress Notes (Signed)
 SDW CALL  Patient was given pre-op instructions over the phone. The opportunity was given for the patient to ask questions. No further questions asked. Patient verbalized understanding of instructions given.   PCP - Dr. Nohemi Rush Cardiologist - has cardiac clearance on 7/1 from [facility].  Echo is scheduled for 06/12/24  PPM/ICD - denies   Chest x-ray - denies EKG - 04/18/2024 Stress Test -  ECHO - scheduled for 06/12/24 Cardiac Cath -   Sleep Study - denies  NO DM  Last dose of GLP1 agonist-  n/a GLP1 instructions: n/a  Blood Thinner Instructions: n/a Aspirin Instructions:  follow surgeon's instructions - last dose was 8/18  ERAS Protcol - clears until 0900 PRE-SURGERY Ensure or G2- n/a  COVID TEST- n/a   Anesthesia review: yes - cardiac history  Patient denies shortness of breath, fever, cough and chest pain over the phone call   All instructions explained to the patient, with a verbal understanding of the material. Patient agrees to go over the instructions while at home for a better understanding.

## 2024-06-05 NOTE — Anesthesia Preprocedure Evaluation (Signed)
 Anesthesia Evaluation  Patient identified by MRN, date of birth, ID band Patient awake    Reviewed: Allergy & Precautions, NPO status , Patient's Chart, lab work & pertinent test results  History of Anesthesia Complications Negative for: history of anesthetic complications  Airway Mallampati: II  TM Distance: >3 FB Neck ROM: Full    Dental  (+) Missing, Teeth Intact,    Pulmonary neg pulmonary ROS, neg sleep apnea, neg COPD, Patient abstained from smoking.Not current smoker   Pulmonary exam normal breath sounds clear to auscultation       Cardiovascular Exercise Tolerance: Good METShypertension, Pt. on medications (-) CAD and (-) Past MI + dysrhythmias Atrial Fibrillation  Rhythm:Irregular Rate:Normal     Neuro/Psych negative neurological ROS  negative psych ROS   GI/Hepatic ,GERD  Controlled and Medicated,,(+)     (-) substance abuse    Endo/Other  neg diabetes    Renal/GU negative Renal ROS     Musculoskeletal  (+) Arthritis , Osteoarthritis,    Abdominal   Peds  Hematology   Anesthesia Other Findings   Reproductive/Obstetrics Right breast cancer                              Anesthesia Physical Anesthesia Plan  ASA: 3  Anesthesia Plan: General   Post-op Pain Management: Tylenol  PO (pre-op)*   Induction: Intravenous  PONV Risk Score and Plan: 4 or greater and Ondansetron , Dexamethasone , Treatment may vary due to age or medical condition and TIVA  Airway Management Planned: LMA  Additional Equipment: None  Intra-op Plan:   Post-operative Plan: Extubation in OR  Informed Consent: I have reviewed the patients History and Physical, chart, labs and discussed the procedure including the risks, benefits and alternatives for the proposed anesthesia with the patient or authorized representative who has indicated his/her understanding and acceptance.     Dental advisory  given  Plan Discussed with: CRNA and Surgeon  Anesthesia Plan Comments:          Anesthesia Quick Evaluation

## 2024-06-06 ENCOUNTER — Other Ambulatory Visit: Payer: Self-pay

## 2024-06-06 ENCOUNTER — Ambulatory Visit (HOSPITAL_COMMUNITY)
Admission: RE | Admit: 2024-06-06 | Discharge: 2024-06-06 | Disposition: A | Discharge status: Home / Self Care | Attending: General Surgery | Admitting: General Surgery

## 2024-06-06 ENCOUNTER — Encounter (HOSPITAL_COMMUNITY)
Admission: RE | Disposition: A | Discharge status: Home / Self Care | Payer: Self-pay | Source: Home / Self Care | Attending: General Surgery

## 2024-06-06 ENCOUNTER — Ambulatory Visit (HOSPITAL_BASED_OUTPATIENT_CLINIC_OR_DEPARTMENT_OTHER): Payer: Self-pay | Admitting: Medical

## 2024-06-06 ENCOUNTER — Encounter (HOSPITAL_COMMUNITY): Payer: Self-pay | Admitting: General Surgery

## 2024-06-06 ENCOUNTER — Ambulatory Visit (HOSPITAL_COMMUNITY): Payer: Self-pay | Admitting: Medical

## 2024-06-06 DIAGNOSIS — D0511 Intraductal carcinoma in situ of right breast: Secondary | ICD-10-CM | POA: Insufficient documentation

## 2024-06-06 DIAGNOSIS — Z170 Estrogen receptor positive status [ER+]: Secondary | ICD-10-CM | POA: Diagnosis not present

## 2024-06-06 DIAGNOSIS — I4891 Unspecified atrial fibrillation: Secondary | ICD-10-CM | POA: Diagnosis not present

## 2024-06-06 DIAGNOSIS — K219 Gastro-esophageal reflux disease without esophagitis: Secondary | ICD-10-CM | POA: Diagnosis not present

## 2024-06-06 DIAGNOSIS — Z1721 Progesterone receptor positive status: Secondary | ICD-10-CM | POA: Insufficient documentation

## 2024-06-06 DIAGNOSIS — I10 Essential (primary) hypertension: Secondary | ICD-10-CM | POA: Diagnosis not present

## 2024-06-06 HISTORY — PX: BREAST LUMPECTOMY: SHX2

## 2024-06-06 SURGERY — BREAST LUMPECTOMY
Anesthesia: General | Site: Breast | Laterality: Right

## 2024-06-06 MED ORDER — CHLORHEXIDINE GLUCONATE CLOTH 2 % EX PADS: 6.0000 | MEDICATED_PAD | Freq: Once | CUTANEOUS | Status: DC

## 2024-06-06 MED ORDER — ACETAMINOPHEN 500 MG PO TABS
1000.0000 mg | ORAL_TABLET | ORAL | Status: AC
  Administered 2024-06-06: 1000 mg via ORAL
  Filled 2024-06-06: qty 2

## 2024-06-06 MED ORDER — 0.9 % SODIUM CHLORIDE (POUR BTL) OPTIME
TOPICAL | Status: DC | PRN
  Administered 2024-06-06: 1000 mL

## 2024-06-06 MED ORDER — ONDANSETRON HCL 4 MG/2ML IJ SOLN
INTRAMUSCULAR | Status: DC | PRN
  Administered 2024-06-06: 4 mg via INTRAVENOUS

## 2024-06-06 MED ORDER — LIDOCAINE 2% (20 MG/ML) 5 ML SYRINGE
INTRAMUSCULAR | Status: DC | PRN
  Administered 2024-06-06: 40 mg via INTRAVENOUS

## 2024-06-06 MED ORDER — BUPIVACAINE-EPINEPHRINE 0.25% -1:200000 IJ SOLN
INTRAMUSCULAR | Status: DC | PRN
  Administered 2024-06-06: 10 mL

## 2024-06-06 MED ORDER — ONDANSETRON HCL 4 MG/2ML IJ SOLN
INTRAMUSCULAR | Status: AC
  Filled 2024-06-06: qty 2

## 2024-06-06 MED ORDER — ORAL CARE MOUTH RINSE: 15.0000 mL | Freq: Once | OROMUCOSAL | Status: AC

## 2024-06-06 MED ORDER — DEXAMETHASONE SODIUM PHOSPHATE 10 MG/ML IJ SOLN
INTRAMUSCULAR | Status: DC | PRN
  Administered 2024-06-06: 5 mg via INTRAVENOUS

## 2024-06-06 MED ORDER — ENSURE PRE-SURGERY PO LIQD: 296.0000 mL | Freq: Once | ORAL | Status: DC

## 2024-06-06 MED ORDER — CHLORHEXIDINE GLUCONATE 0.12 % MT SOLN
15.0000 mL | Freq: Once | OROMUCOSAL | Status: AC
  Administered 2024-06-06: 15 mL via OROMUCOSAL
  Filled 2024-06-06: qty 15

## 2024-06-06 MED ORDER — LIDOCAINE 2% (20 MG/ML) 5 ML SYRINGE
INTRAMUSCULAR | Status: AC
  Filled 2024-06-06: qty 5

## 2024-06-06 MED ORDER — LACTATED RINGERS IV SOLN: INTRAVENOUS | Status: DC

## 2024-06-06 MED ORDER — DEXAMETHASONE SODIUM PHOSPHATE 10 MG/ML IJ SOLN
INTRAMUSCULAR | Status: AC
Start: 2024-06-06 — End: 2024-06-06
  Filled 2024-06-06: qty 1

## 2024-06-06 MED ORDER — EPHEDRINE SULFATE (PRESSORS) 50 MG/ML IJ SOLN
INTRAMUSCULAR | Status: DC | PRN
  Administered 2024-06-06: 5 mg via INTRAVENOUS

## 2024-06-06 MED ORDER — PROPOFOL 10 MG/ML IV BOLUS
INTRAVENOUS | Status: AC
  Filled 2024-06-06: qty 20

## 2024-06-06 MED ORDER — FENTANYL CITRATE (PF) 250 MCG/5ML IJ SOLN
INTRAMUSCULAR | Status: AC
  Filled 2024-06-06: qty 5

## 2024-06-06 MED ORDER — PROPOFOL 10 MG/ML IV BOLUS
INTRAVENOUS | Status: DC | PRN
  Administered 2024-06-06: 170 mg via INTRAVENOUS

## 2024-06-06 MED ORDER — FENTANYL CITRATE (PF) 250 MCG/5ML IJ SOLN
INTRAMUSCULAR | Status: DC | PRN
  Administered 2024-06-06 (×2): 50 ug via INTRAVENOUS

## 2024-06-06 MED ORDER — HEMOSTATIC AGENTS (NO CHARGE) OPTIME
TOPICAL | Status: DC | PRN
  Administered 2024-06-06: 1 via TOPICAL

## 2024-06-06 MED ORDER — CEFAZOLIN SODIUM-DEXTROSE 2-4 GM/100ML-% IV SOLN
2.0000 g | INTRAVENOUS | Status: AC
  Administered 2024-06-06: 2 g via INTRAVENOUS
  Filled 2024-06-06: qty 100

## 2024-06-06 MED ORDER — BUPIVACAINE-EPINEPHRINE (PF) 0.25% -1:200000 IJ SOLN
INTRAMUSCULAR | Status: AC
  Filled 2024-06-06: qty 30

## 2024-06-06 SURGICAL SUPPLY — 32 items
BINDER BREAST LRG (GAUZE/BANDAGES/DRESSINGS) IMPLANT
BINDER BREAST XLRG (GAUZE/BANDAGES/DRESSINGS) IMPLANT
CANISTER SUCTION 3000ML PPV (SUCTIONS) ×1 IMPLANT
CHLORAPREP W/TINT 26 (MISCELLANEOUS) ×1 IMPLANT
CLIP APPLIE 9.375 MED OPEN (MISCELLANEOUS) IMPLANT
CLIP TI MEDIUM 6 (CLIP) IMPLANT
CNTNR URN SCR LID CUP LEK RST (MISCELLANEOUS) IMPLANT
COVER PROBE W GEL 5X96 (DRAPES) ×1 IMPLANT
COVER SURGICAL LIGHT HANDLE (MISCELLANEOUS) ×1 IMPLANT
DERMABOND ADVANCED .7 DNX12 (GAUZE/BANDAGES/DRESSINGS) ×1 IMPLANT
DEVICE DUBIN SPECIMEN MAMMOGRA (MISCELLANEOUS) ×1 IMPLANT
DRAPE CHEST BREAST 15X10 FENES (DRAPES) ×1 IMPLANT
ELECT COATED BLADE 2.86 ST (ELECTRODE) ×1 IMPLANT
ELECTRODE REM PT RTRN 9FT ADLT (ELECTROSURGICAL) ×1 IMPLANT
GLOVE BIO SURGEON STRL SZ7 (GLOVE) ×2 IMPLANT
GLOVE BIOGEL PI IND STRL 7.5 (GLOVE) ×1 IMPLANT
GOWN STRL REUS W/ TWL LRG LVL3 (GOWN DISPOSABLE) ×2 IMPLANT
KIT BASIN OR (CUSTOM PROCEDURE TRAY) ×1 IMPLANT
KIT MARKER MARGIN INK (KITS) ×1 IMPLANT
LIGHT WAVEGUIDE WIDE FLAT (MISCELLANEOUS) IMPLANT
NDL HYPO 25GX1X1/2 BEV (NEEDLE) ×1 IMPLANT
NEEDLE HYPO 25GX1X1/2 BEV (NEEDLE) ×1 IMPLANT
NS IRRIG 1000ML POUR BTL (IV SOLUTION) ×1 IMPLANT
PACK GENERAL/GYN (CUSTOM PROCEDURE TRAY) ×1 IMPLANT
POWDER SURGICEL 3.0 GRAM (HEMOSTASIS) IMPLANT
STRIP CLOSURE SKIN 1/2X4 (GAUZE/BANDAGES/DRESSINGS) ×1 IMPLANT
SUT MNCRL AB 4-0 PS2 18 (SUTURE) ×1 IMPLANT
SUT SILK 2 0 SH (SUTURE) IMPLANT
SUT VIC AB 2-0 SH 27XBRD (SUTURE) ×1 IMPLANT
SUT VIC AB 3-0 SH 27X BRD (SUTURE) ×1 IMPLANT
SYR CONTROL 10ML LL (SYRINGE) ×1 IMPLANT
TOWEL GREEN STERILE FF (TOWEL DISPOSABLE) ×1 IMPLANT

## 2024-06-06 NOTE — Interval H&P Note (Signed)
 History and Physical Interval Note:  06/06/2024 10:48 AM  Rhonda Bell  has presented today for surgery, with the diagnosis of RIGHT BREAST DCIS.  The various methods of treatment have been discussed with the patient and family. After consideration of risks, benefits and other options for treatment, the patient has consented to  Procedure(s) with comments: BREAST LUMPECTOMY (Right) - RIGHT BREAST RE-EXCISION LUMPECTOMY as a surgical intervention.  The patient's history has been reviewed, patient examined, no change in status, stable for surgery.  I have reviewed the patient's chart and labs.  Questions were answered to the patient's satisfaction.     Donnice Bury

## 2024-06-06 NOTE — Transfer of Care (Signed)
 Immediate Anesthesia Transfer of Care Note  Patient: Rhonda Bell  Procedure(s) Performed: BREAST LUMPECTOMY, RE-EXCISION (Right: Breast)  Patient Location: PACU  Anesthesia Type:General  Level of Consciousness: drowsy and patient cooperative  Airway & Oxygen Therapy: Patient Spontanous Breathing and Patient connected to face mask oxygen  Post-op Assessment: Report given to RN and Post -op Vital signs reviewed and stable  Post vital signs: Reviewed and stable  Last Vitals:  Vitals Value Taken Time  BP 156/96 06/06/24 12:22  Temp    Pulse 69 06/06/24 12:25  Resp 13 06/06/24 12:25  SpO2 100 % 06/06/24 12:25  Vitals shown include unfiled device data.  Last Pain:  Vitals:   06/06/24 0951  TempSrc: Oral  PainSc:          Complications: No notable events documented.

## 2024-06-06 NOTE — Progress Notes (Signed)
 Labs acceptable on 04/28/24 for today's surgery per Dr. Treen.

## 2024-06-06 NOTE — Op Note (Signed)
 Preoperative diagnosis: Right breast ductal carcinoma in situ s/p lumpectomy with multiple positive margins Postoperative diagnosis: Same as above Procedure: Right breast re-excision lumpectomy Surgeon: Dr. Adina Bury Anesthesia: General Estimated blood loss: Minimal Specimens: right breast superior, lateral, anterior, posterior, medial margins marked short superior, long lateral and double deep Complications: None Drains: None Sponge needle count was correct completion Disposition recovery in stable condition   Indications: 78 year old female who underwent a screening mammogram at an outside facility.  She has heterogeneously dense breasts. She has an indeterminate asymmetry in the central posterior right breast. The left breast is stable. She underwent a biopsy with a ribbon clip placement. 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 She is here today to discuss her options. She had a very large hematoma after a biopsy that is slowly resolving. I did a lumpectomy with five positive margins. We discussed all options and she has seen plastic surgery.  We have elected to attempt another margin excision.    Procedure: After informed consent was obtained she first had a seed placed.  I these mammograms available for my review.   She was given antibiotics.  SCDs were placed.  She was placed under general anesthesia without complication.  She was prepped and draped in standard sterile surgical fashion.  A surgical timeout was then performed.   I infiltrated Marcaine  throughout the lateral right breast.  I then reentered my old incision. I evacuated some hematoma.  I released all the old sutures. I then removed all the margins as above.    I then obtained hemostasis.  I closed the breast tissue with 2-0 Vicryl.  The skin was closed with 3-0 Vicryl and 4-0 Monocryl.  Glue and Steri-Strips were applied.  She tolerated this  well was extubated and transferred to recovery stable.

## 2024-06-06 NOTE — Anesthesia Postprocedure Evaluation (Signed)
 Anesthesia Post Note  Patient: Rhonda Bell  Procedure(s) Performed: BREAST LUMPECTOMY, RE-EXCISION (Right: Breast)     Patient location during evaluation: PACU Anesthesia Type: General Level of consciousness: awake and alert Pain management: pain level controlled Vital Signs Assessment: post-procedure vital signs reviewed and stable Respiratory status: spontaneous breathing, nonlabored ventilation, respiratory function stable and patient connected to nasal cannula oxygen Cardiovascular status: blood pressure returned to baseline and stable Postop Assessment: no apparent nausea or vomiting Anesthetic complications: no   No notable events documented.  Last Vitals:  Vitals:   06/06/24 1245 06/06/24 1300  BP: (!) 150/67 (!) 146/68  Pulse: 68 71  Resp: 13 14  Temp:  (!) 36.3 C  SpO2: 100% 99%    Last Pain:  Vitals:   06/06/24 1223  TempSrc:   PainSc: 0-No pain                 Thom JONELLE Peoples

## 2024-06-06 NOTE — Discharge Instructions (Signed)
 [facility] Office Phone Number [phone]  POST OP INSTRUCTIONS Take 400 mg of ibuprofen every 8 hours or 650 mg tylenol  every 6 hours for next 72 hours then as needed. Use ice several times daily also.  A prescription for pain medication may be given to you upon discharge.  Take your pain medication as prescribed, if needed.  If narcotic pain medicine is not needed, then you may take acetaminophen  (Tylenol ), naprosyn (Alleve) or ibuprofen (Advil) as needed. Take your usually prescribed medications unless otherwise directed If you need a refill on your pain medication, please contact your pharmacy.  They will contact our office to request authorization.  Prescriptions will not be filled after 5pm or on week-ends. You should eat very light the first 24 hours after surgery, such as soup, crackers, pudding, etc.  Resume your normal diet the day after surgery. Most patients will experience some swelling and bruising in the breast.  Ice packs and a good support bra will help.  Wear the breast binder provided or a sports bra for 72 hours day and night.  After that wear a sports bra during the day until you return to the office. Swelling and bruising can take several days to resolve.  It is common to experience some constipation if taking pain medication after surgery.  Increasing fluid intake and taking a stool softener will usually help or prevent this problem from occurring.  A mild laxative (Milk of Magnesia or Miralax) should be taken according to package directions if there are no bowel movements after 48 hours. I used skin glue on the incision, you may shower in 24 hours.  The glue will flake off over the next 2-3 weeks as will the steristrips.   Any sutures or staples will be removed at the office during your follow-up visit. ACTIVITIES:  You may resume regular daily activities (gradually increasing) beginning the next day.  Wearing a good support bra or sports bra minimizes pain and  swelling.  You may have sexual intercourse when it is comfortable. You may drive when you no longer are taking prescription pain medication, you can comfortably wear a seatbelt, and you can safely maneuver your car and apply brakes. RETURN TO WORK:  ______________________________________________________________________________________ Rhonda Bell should see your doctor in the office for a follow-up appointment approximately two to three weeks after your surgery.  Your doctor's nurse will typically make your follow-up appointment when she calls you with your pathology report.  Expect your pathology report 3-4 business days after your surgery.  You may call to check if you do not hear from us  after three days.  WHEN TO CALL DR Rhonda Bell: Fever over 101.0 Nausea and/or vomiting. Extreme swelling or bruising. Continued bleeding from incision. Increased pain, redness, or drainage from the incision.  The clinic staff is available to answer your questions during regular business hours.  Please don't hesitate to call and ask to speak to one of the nurses for clinical concerns.  If you have a medical emergency, go to the nearest emergency room or call 911.  A surgeon from [facility] is always on call at the hospital.  For further questions, please visit [email or web address]

## 2024-06-06 NOTE — Anesthesia Procedure Notes (Signed)
 Procedure Name: LMA Insertion Date/Time: 06/06/2024 11:27 AM  Performed by: Elby Raelene SAUNDERS, CRNAPre-anesthesia Checklist: Patient identified, Emergency Drugs available, Suction available and Patient being monitored Patient Re-evaluated:Patient Re-evaluated prior to induction Oxygen Delivery Method: Circle System Utilized Preoxygenation: Pre-oxygenation with 100% oxygen Induction Type: IV induction Ventilation: Mask ventilation without difficulty LMA: LMA inserted LMA Size: 4.0 Number of attempts: 1 Placement Confirmation: positive ETCO2 Tube secured with: Tape Dental Injury: Teeth and Oropharynx as per pre-operative assessment

## 2024-06-07 ENCOUNTER — Encounter (HOSPITAL_COMMUNITY): Payer: Self-pay | Admitting: General Surgery

## 2024-06-09 LAB — SURGICAL PATHOLOGY

## 2024-06-12 ENCOUNTER — Other Ambulatory Visit (HOSPITAL_COMMUNITY)

## 2024-06-15 ENCOUNTER — Institutional Professional Consult (permissible substitution): Admitting: Cardiology

## 2024-06-16 ENCOUNTER — Telehealth: Payer: Self-pay | Admitting: Hematology and Oncology

## 2024-06-16 ENCOUNTER — Telehealth: Payer: Self-pay | Admitting: *Deleted

## 2024-06-16 ENCOUNTER — Ambulatory Visit (HOSPITAL_BASED_OUTPATIENT_CLINIC_OR_DEPARTMENT_OTHER): Admit: 2024-06-16 | Admitting: Plastic Surgery

## 2024-06-16 ENCOUNTER — Encounter (HOSPITAL_BASED_OUTPATIENT_CLINIC_OR_DEPARTMENT_OTHER): Payer: Self-pay

## 2024-06-16 ENCOUNTER — Encounter: Payer: Self-pay | Admitting: *Deleted

## 2024-06-16 DIAGNOSIS — C50911 Malignant neoplasm of unspecified site of right female breast: Secondary | ICD-10-CM | POA: Insufficient documentation

## 2024-06-16 SURGERY — MAMMOPLASTY, REDUCTION
Anesthesia: General | Site: Breast | Laterality: Bilateral

## 2024-06-16 NOTE — Telephone Encounter (Signed)
 Ordered oncotype per Dr. [name]. Sent requisition to pathology and exact sciences.

## 2024-06-16 NOTE — Telephone Encounter (Signed)
 Scheduled appointments per referral. Talked with the patient and she is aware of the appointment time and date as well as the address. Patient was informed to arrive 10-15 minutes prior with updated insurance information. Patient was also informed that two guests are permitted but needs to be at least 77 years of age. All questions were answered.

## 2024-06-21 ENCOUNTER — Encounter: Payer: Self-pay | Admitting: Hematology and Oncology

## 2024-06-21 ENCOUNTER — Inpatient Hospital Stay

## 2024-06-21 ENCOUNTER — Inpatient Hospital Stay: Attending: Hematology and Oncology | Admitting: Hematology and Oncology

## 2024-06-21 ENCOUNTER — Ambulatory Visit: Admitting: Nurse Practitioner

## 2024-06-21 VITALS — BP 148/72 | HR 75 | Temp 98.5°F | Resp 17 | Wt 157.9 lb

## 2024-06-21 DIAGNOSIS — Z170 Estrogen receptor positive status [ER+]: Secondary | ICD-10-CM | POA: Diagnosis not present

## 2024-06-21 DIAGNOSIS — Z803 Family history of malignant neoplasm of breast: Secondary | ICD-10-CM | POA: Diagnosis not present

## 2024-06-21 DIAGNOSIS — I4891 Unspecified atrial fibrillation: Secondary | ICD-10-CM | POA: Insufficient documentation

## 2024-06-21 DIAGNOSIS — C50911 Malignant neoplasm of unspecified site of right female breast: Secondary | ICD-10-CM | POA: Insufficient documentation

## 2024-06-21 NOTE — Progress Notes (Addendum)
 [name]  Patient Care Team: Norleen Nohemi Shuck, MD as PCP - General (Internal Medicine)  CHIEF COMPLAINTS/PURPOSE OF CONSULTATION:  Newly diagnosed breast cancer  HISTORY OF PRESENTING ILLNESS:  Rhonda Bell 78 y.o. female is here because of recent diagnosis of right breast DCIS and IDC  I reviewed her records extensively and collaborated the history with the patient.  SUMMARY OF ONCOLOGIC HISTORY: Oncology History  Malignant neoplasm of right breast in female, estrogen receptor positive (HCC)  06/06/2024 Pathology Results   A. RIGHT BREAST, MEDIAL MARGIN, RE-EXCISION: Focal residual ductal carcinoma in situ, intermediate to high nuclear grade, micropapillary papillary and clinging type Negative for invasive carcinoma Margin free (DCIS 8 mm from new medial margin) Focal atypical lobular hyperplasia Multiple small complex sclerosing lesions and sclerosed/intraductal papillomas Fibrocystic changes including stromal fibrosis, cystic dilatation of ducts, sclerosing adenosis and usual duct hyperplasia Microcalcifications present within adenosis and benign ducts Changes consistent with prior procedure  B. RIGHT BREAST, SUPERIOR MARGIN, RE-EXCISION: Atypical lobular hyperplasia Complex sclerosing lesion Fibrocystic changes including stromal fibrosis, cystic dilatation of ducts and adenosis Microcalcifications present within benign ducts and adenosis Changes consistent with prior procedure Negative for carcinoma  C. RIGHT BREAST, LATERAL MARGIN, RE-EXCISION: Invasive poorly differentiated ductal adenocarcinoma, grade 3 (3+2+3) Ductal carcinoma in situ, intermediate to high nuclear grade, micropapillary and clinging type Invasive tumor measures at least 0.8 cm in greatest dimension (pT1b) Margin free of carcinoma (invasive tumor 7 mm and DCIS 6 mm from new lateral margin) Negative for angiolymphatic invasion Focal atypical lobular  hyperplasia Complex sclerosing lesion Fibrocystic changes including stromal fibrosis, cystic dilatation of ducts, adenosis and usual duct hyperplasia Microcalcifications present within adenosis and benign fibromatoid stroma Changes consistent with prior procedure  D. RIGHT BREAST, ANTERIOR MARGIN, RE-EXCISION: Ductal carcinoma in situ, intermediate to high nuclear grade, micropapillary and clinging type Negative for invasive carcinoma DCIS present in new anterior margin Changes consistent with prior biopsy Small sclerosed intraductal papilloma Focal fibrocystic changes including stromal fibrosis, cystic dilatation of ducts and adenosis Microcalcifications present within adenosis and papilloma  E. RIGHT BREAST, POSTERIOR MARGIN, RE-EXCISION: Ductal carcinoma in situ, intermediate to high nuclear grade, micropapillary and clinging types Negative for invasive carcinoma Margin free (DCIS 0.4 mm from the new posterior margin) Changes consistent with prior procedure Small complex sclerosing lesion, sclerosed intraductal papilloma and fibromatoid change Fibrocystic changes including stromal fibrosis, cystic dilatation of ducts, adenosis and usual duct hyperplasia Microcalcifications present within adenosis and fibromatoid stroma  ONCOLOGY TABLE:  INVASIVE CARCINOMA OF THE BREAST:  Resection    06/16/2024 Initial Diagnosis   Malignant neoplasm of right breast in female, estrogen receptor positive (HCC)   06/21/2024 Cancer Staging   Staging form: Breast, AJCC 8th Edition - Clinical: Stage IA (cT1, cN0, cM0, G3, ER+, PR+, HER2-) - Signed by Loretha Ash, MD on 06/21/2024 Histologic grading system: 3 grade system    Discussed the use of AI scribe software for clinical note transcription with the patient, who gave verbal consent to proceed.  History of Present Illness Rhonda Bell is a 78 year old female with breast cancer who presents for follow-up after lumpectomy and discussion  of further treatment options. She was referred by Dr. [name] for further evaluation and management of her breast cancer.  She has a history of breast cancer, initially diagnosed as ductal carcinoma in situ (DCIS). She underwent two lumpectomies, the first revealing extensive DCIS approximately five centimeters in size. During the second surgery, a small invasive ductal carcinoma measuring  eight millimeters was discovered, not near the margins. The invasive cancer is grade three, estrogen receptor positive, progesterone receptor positive, HER2 negative, and has a low proliferation index (KI-67 of 1%).  Her family history is significant for breast cancer, as both her mother and sister were diagnosed with the disease after the age of 53. Her mother lived to nearly 90 years old, and her sister remains active and healthy.  Socially, she is very active, working part-time in [employer] and engaging in activities such as attending church. She lives independently.  She has no history of blood clots and is cautious about her health, having canceled other medical appointments to focus on her current treatment. She is considering a Watchman device for atrial fibrillation management to avoid long-term anticoagulation due to her active lifestyle.  No symptoms of pain or discomfort post-surgery. No history of blood clots or falls.  MEDICAL HISTORY:  Past Medical History:  Diagnosis Date   Arthritis    Dysrhythmia    A-fib   GERD (gastroesophageal reflux disease)    Hypertension     SURGICAL HISTORY: Past Surgical History:  Procedure Laterality Date   BREAST BIOPSY  05/05/2024   MM RT RADIOACTIVE SEED LOC MAMMO GUIDE 05/05/2024 [facility] MAMMOGRAPHY   BREAST LUMPECTOMY Left    BREAST LUMPECTOMY Right 06/06/2024   Procedure: BREAST LUMPECTOMY, RE-EXCISION;  Surgeon: [name] Cough, MD;  Location: [facility] OR;  Service: General;  Laterality: Right;  RIGHT BREAST RE-EXCISION LUMPECTOMY    BREAST LUMPECTOMY WITH RADIOACTIVE SEED LOCALIZATION Right 05/08/2024   Procedure: BREAST LUMPECTOMY WITH RADIOACTIVE SEED LOCALIZATION;  Surgeon: [name] Cough, MD;  Location: [facility] OR;  Service: General;  Laterality: Right;  LMA RIGHT BREAST SEED GUIDED LUMPECTOMY   THYROIDECTOMY, PARTIAL     TONSILLECTOMY      SOCIAL HISTORY: Social History   Socioeconomic History   Marital status: Widowed    Spouse name: Not on file   Number of children: Not on file   Years of education: Not on file   Highest education level: Not on file  Occupational History   Not on file  Tobacco Use   Smoking status: Never   Smokeless tobacco: Never  Vaping Use   Vaping status: Not on file  Substance and Sexual Activity   Alcohol use: Never   Drug use: Never   Sexual activity: Not on file  Other Topics Concern   Not on file  Social History Narrative   Not on file   Social Drivers of Health   Financial Resource Strain: Not on file  Food Insecurity: Low Risk  (12/09/2023)   Received from [facility]   Hunger Vital Sign    Within the past 12 months, you worried that your food would run out before you got money to buy more: Never true    Within the past 12 months, the food you bought just didn't last and you didn't have money to get more. : Never true  Transportation Needs: No Transportation Needs (12/09/2023)   Received from [facility]    In the past 12 months, has lack of reliable transportation kept you from medical appointments, meetings, work or from getting things needed for daily living? : No  Physical Activity: Not on file  Stress: Not on file  Social Connections: Not on file  Intimate Partner Violence: Not on file    FAMILY HISTORY: No family history on file.  ALLERGIES:  is allergic to lisinopril, penicillins, and sulfa antibiotics.  MEDICATIONS:  Current Outpatient Medications  Medication Sig Dispense Refill   aspirin EC 81 MG tablet Take 81 mg by mouth  daily with lunch.     b complex vitamins capsule Take 1 capsule by mouth daily with lunch.     cetirizine (ZYRTEC) 10 MG tablet Take 10 mg by mouth in the morning.     hydrochlorothiazide  (HYDRODIURIL ) 12.5 MG tablet Take 1 tablet (12.5 mg total) by mouth daily. 90 tablet 3   meloxicam  (MOBIC ) 7.5 MG tablet Take 1 tablet (7.5 mg total) by mouth daily as needed for pain. 90 tablet 0   metoprolol  succinate (TOPROL -XL) 100 MG 24 hr tablet Take 1 tablet (100 mg total) by mouth daily. 90 tablet 0   Misc Natural Products (OSTEO BI-FLEX TRIPLE STRENGTH) TABS Take 1 tablet by mouth daily with lunch.     omeprazole  (PRILOSEC) 20 MG capsule Take 1 capsule (20 mg total) by mouth daily. 90 capsule 0   potassium chloride  SA (KLOR-CON  M) 20 MEQ tablet Take 1 tablet (20 mEq total) by mouth daily. 90 tablet 2   telmisartan  (MICARDIS ) 80 MG tablet Take 1 tablet (80 mg total) by mouth daily. 90 tablet 1   Vitamin D-Vitamin K (D3 + K2 PO) Take 1 tablet by mouth daily with lunch.     No current facility-administered medications for this visit.     All other systems were reviewed with the patient and are negative.  PHYSICAL EXAMINATION: ECOG PERFORMANCE STATUS: 0 - Asymptomatic  Vitals:   06/21/24 1116  BP: (!) 148/72  Pulse: 75  Resp: 17  Temp: 98.5 F (36.9 C)  SpO2: 98%   Filed Weights   06/21/24 1116  Weight: 157 lb 14.4 oz (71.6 kg)    GENERAL:alert, no distress and comfortable   LABORATORY DATA:  I have reviewed the data as listed Lab Results  Component Value Date   WBC 6.5 05/02/2024   HGB 12.8 05/02/2024   HCT 39.4 05/02/2024   MCV 91.8 05/02/2024   PLT 199 05/02/2024   Lab Results  Component Value Date   NA 144 04/28/2024   K 3.6 04/28/2024   CL 101 04/28/2024   CO2 22 04/28/2024    RADIOGRAPHIC STUDIES: I have personally reviewed the radiological reports and agreed with the findings in the report.  ASSESSMENT AND PLAN:  Assessment and Plan Assessment & Plan Invasive  ductal carcinoma of right breast, ER/PR positive, HER2 negative, grade 3, with extensive ductal carcinoma in situ Invasive ductal carcinoma, 8 mm, ER/PR positive, HER2 negative, grade 3, with extensive DCIS. Invasive component at margin. High ER/PR positivity targets endocrine therapy. HER2 negativity indicates less aggression. Low KI-67 (1%) suggests slow growth. Grade 3 indicates high de-differentiation. Oncotype DX to assess recurrence risk and chemotherapy benefit. - Await Oncotype DX test results to guide further treatment decisions.. - Discuss role of adj radiation with radiation oncology - Recommend adjuvant endocrine therapy with aromatase inhibitor post-radiation. - Proceed with planned re excision  Planned adjuvant endocrine therapy with aromatase inhibitor for left breast cancer Adjuvant endocrine therapy with aromatase inhibitor planned due to high ER/PR positivity. Discussed tamoxifen  vs aromatase inhibitors.  - Initiate aromatase inhibitor therapy post-radiation.  Monitoring for osteoporosis during aromatase inhibitor therapy Monitoring for osteoporosis necessary during aromatase inhibitor therapy due to risk of accelerated bone loss. - Conduct bone density scan every two years during aromatase inhibitor therapy.   All questions were answered. The patient knows to call the clinic with any problems, questions or  concerns.    Amber Stalls, MD 06/21/24

## 2024-06-22 ENCOUNTER — Encounter: Payer: Self-pay | Admitting: *Deleted

## 2024-06-27 ENCOUNTER — Encounter (HOSPITAL_BASED_OUTPATIENT_CLINIC_OR_DEPARTMENT_OTHER): Payer: Self-pay | Admitting: General Surgery

## 2024-06-27 NOTE — Progress Notes (Signed)
 Pre op cards clearance 04/18/24- okay for [facility] per Dr. Tilford with clearance from July 2025.

## 2024-06-28 ENCOUNTER — Inpatient Hospital Stay: Admitting: Licensed Clinical Social Worker

## 2024-06-28 NOTE — Progress Notes (Signed)
 [facility] Clinical Social Work  Initial Assessment   Rhonda Bell is a 78 y.o. year old female contacted by phone. Clinical Social Work was referred by new patient protocol for assessment of psychosocial needs.   SDOH (Social Determinants of Health) assessments performed: Yes SDOH Interventions    Flowsheet Row Clinical Support from 06/28/2024 in [facility] [place] [place]  SDOH Interventions   Food Insecurity Interventions Intervention Not Indicated  Housing Interventions Intervention Not Indicated  Transportation Interventions Intervention Not Indicated  Utilities Interventions Intervention Not Indicated    SDOH Screenings   Food Insecurity: No Food Insecurity (06/28/2024)  Housing: Low Risk  (06/28/2024)  Transportation Needs: No Transportation Needs (06/28/2024)  Utilities: Not At Risk (06/28/2024)  Tobacco Use: Low Risk  (06/27/2024)    PHQ 2/9:    12/15/2016   10:40 AM 11/17/2016   10:46 AM  Depression screen PHQ 2/9  Decreased Interest 0 0  Down, Depressed, Hopeless 0 0  PHQ - 2 Score 0 0     Distress Screen completed: No     No data to display            Family/Social Information:  Housing Arrangement: patient lives alone. Her 2 daughters live in [place] Family members/support persons in your life? 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  Income source: Actor concerns: No Type of concern: None Food access concerns: no Religious or spiritual practice: Yes-very involved in her church and supported there. Her faith is strong Advanced directives: Yes Services Currently in place:  [employer]  Coping/ Adjustment to diagnosis: Patient understands treatment plan and what happens next? yes, pt is having another re-excision and was able to articulate reasoning and next steps after. She is coping very well  mentally and has physically recovered quickly from prior surgeries. 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 and fulfill her purpose Patient enjoys being active, going to church, Stage manager, being with family and friends Current coping skills/ strengths: Ability for insight , Active sense of humor , Capable of independent living , Manufacturing systems engineer , Motivation for treatment/growth , Religious Affiliation , Special hobby/interest , and Supportive family/friends     SUMMARY: Current SDOH Barriers:  No major barriers identified today  Clinical Social Work Clinical Goal(s):  No clinical social work goals at this time  Interventions: Discussed the importance of support during treatment Informed patient of the support team roles and support services at [facility] Provided CSW contact information and encouraged patient to call with any questions or concerns   Follow Up Plan: Patient will contact CSW with any support or resource needs Patient verbalizes understanding of plan: Yes    Rhonda Bell E Salvatore Shear, LCSW Clinical Social Worker [facility]

## 2024-06-29 ENCOUNTER — Telehealth: Payer: Self-pay | Admitting: *Deleted

## 2024-06-29 ENCOUNTER — Encounter (HOSPITAL_COMMUNITY): Payer: Self-pay

## 2024-06-29 NOTE — Telephone Encounter (Signed)
 Received oncotype results of 27/16%.  Msg sent to the team

## 2024-06-30 ENCOUNTER — Encounter: Payer: Self-pay | Admitting: *Deleted

## 2024-06-30 ENCOUNTER — Inpatient Hospital Stay: Admitting: Hematology and Oncology

## 2024-06-30 ENCOUNTER — Encounter (HOSPITAL_BASED_OUTPATIENT_CLINIC_OR_DEPARTMENT_OTHER)
Admission: RE | Admit: 2024-06-30 | Discharge: 2024-06-30 | Disposition: A | Discharge status: Home / Self Care | Source: Ambulatory Visit | Attending: General Surgery | Admitting: General Surgery

## 2024-06-30 ENCOUNTER — Other Ambulatory Visit (HOSPITAL_BASED_OUTPATIENT_CLINIC_OR_DEPARTMENT_OTHER)

## 2024-06-30 DIAGNOSIS — Z170 Estrogen receptor positive status [ER+]: Secondary | ICD-10-CM | POA: Diagnosis not present

## 2024-06-30 DIAGNOSIS — Z01812 Encounter for preprocedural laboratory examination: Secondary | ICD-10-CM | POA: Insufficient documentation

## 2024-06-30 DIAGNOSIS — C50911 Malignant neoplasm of unspecified site of right female breast: Secondary | ICD-10-CM | POA: Diagnosis not present

## 2024-06-30 DIAGNOSIS — Z01818 Encounter for other preprocedural examination: Secondary | ICD-10-CM

## 2024-06-30 LAB — BASIC METABOLIC PANEL WITH GFR
Anion gap: 11 (ref 5–15)
BUN: 22 mg/dL (ref 8–23)
CO2: 28 mmol/L (ref 22–32)
Calcium: 9.2 mg/dL (ref 8.9–10.3)
Chloride: 101 mmol/L (ref 98–111)
Creatinine, Ser: 0.9 mg/dL (ref 0.44–1.00)
GFR, Estimated: >60 mL/min (ref >60)
Glucose, Bld: 97 mg/dL (ref 70–99)
Potassium: 3.8 mmol/L (ref 3.5–5.1)
Sodium: 140 mmol/L (ref 135–145)

## 2024-06-30 NOTE — Progress Notes (Signed)
 [facility] CONSULT NOTE  Patient Care Team: Norleen Nohemi Shuck, MD as PCP - General (Internal Medicine) [name] Cough, MD as Consulting Physician (General Surgery) Gerome, Devere HERO, RN as Oncology Nurse Navigator Tyree Nanetta SAILOR, RN as Oncology Nurse Navigator Loretha Ash, MD as Consulting Physician (Hematology and Oncology) Izell Domino, MD as Attending Physician (Radiation Oncology)  CHIEF COMPLAINTS/PURPOSE OF CONSULTATION:  Newly diagnosed breast cancer  HISTORY OF PRESENTING ILLNESS:  Rhonda Bell 78 y.o. female is here because of recent diagnosis of right breast DCIS and IDC  I reviewed her records extensively and collaborated the history with the patient.  SUMMARY OF ONCOLOGIC HISTORY: Oncology History  Malignant neoplasm of right breast in female, estrogen receptor positive (HCC)  06/06/2024 Pathology Results   A. RIGHT BREAST, MEDIAL MARGIN, RE-EXCISION: Focal residual ductal carcinoma in situ, intermediate to high nuclear grade, micropapillary papillary and clinging type Negative for invasive carcinoma Margin free (DCIS 8 mm from new medial margin) Focal atypical lobular hyperplasia Multiple small complex sclerosing lesions and sclerosed/intraductal papillomas Fibrocystic changes including stromal fibrosis, cystic dilatation of ducts, sclerosing adenosis and usual duct hyperplasia Microcalcifications present within adenosis and benign ducts Changes consistent with prior procedure  B. RIGHT BREAST, SUPERIOR MARGIN, RE-EXCISION: Atypical lobular hyperplasia Complex sclerosing lesion Fibrocystic changes including stromal fibrosis, cystic dilatation of ducts and adenosis Microcalcifications present within benign ducts and adenosis Changes consistent with prior procedure Negative for carcinoma  C. RIGHT BREAST, LATERAL MARGIN, RE-EXCISION: Invasive poorly differentiated ductal adenocarcinoma, grade 3 (3+2+3) Ductal carcinoma in situ,  intermediate to high nuclear grade, micropapillary and clinging type Invasive tumor measures at least 0.8 cm in greatest dimension (pT1b) Margin free of carcinoma (invasive tumor 7 mm and DCIS 6 mm from new lateral margin) Negative for angiolymphatic invasion Focal atypical lobular hyperplasia Complex sclerosing lesion Fibrocystic changes including stromal fibrosis, cystic dilatation of ducts, adenosis and usual duct hyperplasia Microcalcifications present within adenosis and benign fibromatoid stroma Changes consistent with prior procedure  D. RIGHT BREAST, ANTERIOR MARGIN, RE-EXCISION: Ductal carcinoma in situ, intermediate to high nuclear grade, micropapillary and clinging type Negative for invasive carcinoma DCIS present in new anterior margin Changes consistent with prior biopsy Small sclerosed intraductal papilloma Focal fibrocystic changes including stromal fibrosis, cystic dilatation of ducts and adenosis Microcalcifications present within adenosis and papilloma  E. RIGHT BREAST, POSTERIOR MARGIN, RE-EXCISION: Ductal carcinoma in situ, intermediate to high nuclear grade, micropapillary and clinging types Negative for invasive carcinoma Margin free (DCIS 0.4 mm from the new posterior margin) Changes consistent with prior procedure Small complex sclerosing lesion, sclerosed intraductal papilloma and fibromatoid change Fibrocystic changes including stromal fibrosis, cystic dilatation of ducts, adenosis and usual duct hyperplasia Microcalcifications present within adenosis and fibromatoid stroma  ONCOLOGY TABLE:  INVASIVE CARCINOMA OF THE BREAST:  Resection    06/16/2024 Initial Diagnosis   Malignant neoplasm of right breast in female, estrogen receptor positive (HCC)   06/21/2024 Cancer Staging   Staging form: Breast, AJCC 8th Edition - Clinical: Stage IA (cT1, cN0, cM0, G3, ER+, PR+, HER2-) - Signed by Loretha Ash, MD on 06/21/2024 Histologic grading system: 3  grade system    Discussed the use of AI scribe software for clinical note transcription with the patient, who gave verbal consent to proceed.  History of Present Illness Rhonda Bell is a 78 year old female with breast cancer who presents for follow-up after lumpectomy and discussion of further treatment options. She was referred by Dr. [name] for further evaluation and management of her breast  cancer.  She has a history of breast cancer, initially diagnosed as ductal carcinoma in situ (DCIS). She underwent two lumpectomies, the first revealing extensive DCIS approximately five centimeters in size. During the second surgery, a small invasive ductal carcinoma measuring eight millimeters was discovered, not near the margins. The invasive cancer is grade three, estrogen receptor positive, progesterone receptor positive, HER2 negative, and has a low proliferation index (KI-67 of 1%).  She is here for a telephone follow up. She denies any complaints today.   MEDICAL HISTORY:  Past Medical History:  Diagnosis Date   Arthritis    Dysrhythmia    A-fib   GERD (gastroesophageal reflux disease)    Hypertension     SURGICAL HISTORY: Past Surgical History:  Procedure Laterality Date   BREAST BIOPSY  05/05/2024   MM RT RADIOACTIVE SEED LOC MAMMO GUIDE 05/05/2024 [facility] MAMMOGRAPHY   BREAST LUMPECTOMY Left    BREAST LUMPECTOMY Right 06/06/2024   Procedure: BREAST LUMPECTOMY, RE-EXCISION;  Surgeon: [name] Cough, MD;  Location: [facility] OR;  Service: General;  Laterality: Right;  RIGHT BREAST RE-EXCISION LUMPECTOMY   BREAST LUMPECTOMY WITH RADIOACTIVE SEED LOCALIZATION Right 05/08/2024   Procedure: BREAST LUMPECTOMY WITH RADIOACTIVE SEED LOCALIZATION;  Surgeon: [name] Cough, MD;  Location: [facility] OR;  Service: General;  Laterality: Right;  LMA RIGHT BREAST SEED GUIDED LUMPECTOMY   THYROIDECTOMY, PARTIAL     TONSILLECTOMY      SOCIAL HISTORY: Social History   Socioeconomic History   Marital  status: Widowed    Spouse name: Not on file   Number of children: Not on file   Years of education: Not on file   Highest education level: Not on file  Occupational History   Not on file  Tobacco Use   Smoking status: Never   Smokeless tobacco: Never  Vaping Use   Vaping status: Not on file  Substance and Sexual Activity   Alcohol use: Never   Drug use: Never   Sexual activity: Not on file  Other Topics Concern   Not on file  Social History Narrative   Not on file   Social Drivers of Health   Financial Resource Strain: Not on file  Food Insecurity: No Food Insecurity (06/28/2024)   Hunger Vital Sign    Worried About Running Out of Food in the Last Year: Never true    Ran Out of Food in the Last Year: Never true  Transportation Needs: No Transportation Needs (06/28/2024)   PRAPARE - Administrator, Civil Service (Medical): No    Lack of Transportation (Non-Medical): No  Physical Activity: Not on file  Stress: Not on file  Social Connections: Not on file  Intimate Partner Violence: Not on file    FAMILY HISTORY: No family history on file.  ALLERGIES:  is allergic to lisinopril, penicillins, and sulfa antibiotics.  MEDICATIONS:  Current Outpatient Medications  Medication Sig Dispense Refill   aspirin EC 81 MG tablet Take 81 mg by mouth daily with lunch.     b complex vitamins capsule Take 1 capsule by mouth daily with lunch.     cetirizine (ZYRTEC) 10 MG tablet Take 10 mg by mouth in the morning.     hydrochlorothiazide  (HYDRODIURIL ) 12.5 MG tablet Take 1 tablet (12.5 mg total) by mouth daily. 90 tablet 3   meloxicam  (MOBIC ) 7.5 MG tablet Take 1 tablet (7.5 mg total) by mouth daily as needed for pain. 90 tablet 0   metoprolol  succinate (TOPROL -XL) 100 MG 24 hr tablet Take  1 tablet (100 mg total) by mouth daily. 90 tablet 0   Misc Natural Products (OSTEO BI-FLEX TRIPLE STRENGTH) TABS Take 1 tablet by mouth daily with lunch.     omeprazole  (PRILOSEC) 20 MG  capsule Take 1 capsule (20 mg total) by mouth daily. 90 capsule 0   potassium chloride  SA (KLOR-CON  M) 20 MEQ tablet Take 1 tablet (20 mEq total) by mouth daily. 90 tablet 2   telmisartan  (MICARDIS ) 80 MG tablet Take 1 tablet (80 mg total) by mouth daily. 90 tablet 1   Vitamin D-Vitamin K (D3 + K2 PO) Take 1 tablet by mouth daily with lunch.     No current facility-administered medications for this visit.     All other systems were reviewed with the patient and are negative.  PHYSICAL EXAMINATION: ECOG PERFORMANCE STATUS: 0 - Asymptomatic  There were no vitals filed for this visit.  There were no vitals filed for this visit.   GENERAL:alert, no distress and comfortable    LABORATORY DATA:  I have reviewed the data as listed Lab Results  Component Value Date   WBC 6.5 05/02/2024   HGB 12.8 05/02/2024   HCT 39.4 05/02/2024   MCV 91.8 05/02/2024   PLT 199 05/02/2024   Lab Results  Component Value Date   NA 140 06/30/2024   K 3.8 06/30/2024   CL 101 06/30/2024   CO2 28 06/30/2024    RADIOGRAPHIC STUDIES: I have personally reviewed the radiological reports and agreed with the findings in the report.  ASSESSMENT AND PLAN:  Assessment and Plan Assessment & Plan Invasive ductal carcinoma of right breast, ER/PR positive, HER2 negative, grade 3, with extensive ductal carcinoma in situ Invasive ductal carcinoma, 8 mm, ER/PR positive, HER2 negative, grade 3, with extensive DCIS. Invasive component at margin. High ER/PR positivity targets endocrine therapy. HER2 negativity indicates less aggression. Low KI-67 (1%) suggests slow growth. Grade 3 indicates high de-differentiation.  She is here to review oncotype dx results via telephone visit. We discussed oncotype dx results, 27, distant risk of recurrence over the next 9 yrs of 16%, there is greater than 15% absolute benefit from chemotherapy. Given the borderline oncotype and concern about toxicity of chemo and QOL with chemo,  pt doesn't want to proceed with chemotherapy. She will proceed with her margin re excision followed by radiation and adj endocrine therapy.  Time spent: 20 min  I connected with  Rudolph Aran on 06/30/24 by a telephone application and verified that I am speaking with the correct person using two identifiers.   I discussed the limitations of evaluation and management by telemedicine. The patient expressed understanding and agreed to proceed.   Location of provider: Office Location of pt: Home.  All questions were answered. The patient knows to call the clinic with any problems, questions or concerns.    Amber Stalls, MD 06/30/24

## 2024-07-02 ENCOUNTER — Other Ambulatory Visit (HOSPITAL_COMMUNITY): Payer: Self-pay | Admitting: General Surgery

## 2024-07-03 ENCOUNTER — Other Ambulatory Visit (HOSPITAL_COMMUNITY): Payer: Self-pay

## 2024-07-03 ENCOUNTER — Other Ambulatory Visit (HOSPITAL_BASED_OUTPATIENT_CLINIC_OR_DEPARTMENT_OTHER): Payer: Self-pay

## 2024-07-03 ENCOUNTER — Ambulatory Visit (HOSPITAL_BASED_OUTPATIENT_CLINIC_OR_DEPARTMENT_OTHER): Admitting: Anesthesiology

## 2024-07-03 ENCOUNTER — Encounter (HOSPITAL_BASED_OUTPATIENT_CLINIC_OR_DEPARTMENT_OTHER)
Admission: RE | Disposition: A | Discharge status: Home / Self Care | Payer: Self-pay | Source: Home / Self Care | Attending: General Surgery

## 2024-07-03 ENCOUNTER — Encounter (HOSPITAL_BASED_OUTPATIENT_CLINIC_OR_DEPARTMENT_OTHER): Payer: Self-pay | Admitting: General Surgery

## 2024-07-03 ENCOUNTER — Ambulatory Visit (HOSPITAL_BASED_OUTPATIENT_CLINIC_OR_DEPARTMENT_OTHER)
Admission: RE | Admit: 2024-07-03 | Discharge: 2024-07-03 | Disposition: A | Discharge status: Home / Self Care | Attending: General Surgery | Admitting: General Surgery

## 2024-07-03 ENCOUNTER — Other Ambulatory Visit: Payer: Self-pay

## 2024-07-03 DIAGNOSIS — D0511 Intraductal carcinoma in situ of right breast: Secondary | ICD-10-CM

## 2024-07-03 DIAGNOSIS — I10 Essential (primary) hypertension: Secondary | ICD-10-CM

## 2024-07-03 DIAGNOSIS — K219 Gastro-esophageal reflux disease without esophagitis: Secondary | ICD-10-CM | POA: Diagnosis not present

## 2024-07-03 DIAGNOSIS — I4891 Unspecified atrial fibrillation: Secondary | ICD-10-CM | POA: Diagnosis not present

## 2024-07-03 DIAGNOSIS — M1990 Unspecified osteoarthritis, unspecified site: Secondary | ICD-10-CM | POA: Insufficient documentation

## 2024-07-03 DIAGNOSIS — Z1721 Progesterone receptor positive status: Secondary | ICD-10-CM | POA: Insufficient documentation

## 2024-07-03 DIAGNOSIS — Z01818 Encounter for other preprocedural examination: Secondary | ICD-10-CM

## 2024-07-03 DIAGNOSIS — Z170 Estrogen receptor positive status [ER+]: Secondary | ICD-10-CM | POA: Diagnosis not present

## 2024-07-03 DIAGNOSIS — Z1732 Human epidermal growth factor receptor 2 negative status: Secondary | ICD-10-CM | POA: Diagnosis not present

## 2024-07-03 HISTORY — PX: AXILLARY SENTINEL NODE BIOPSY: SHX5738

## 2024-07-03 HISTORY — PX: RE-EXCISION OF BREAST LUMPECTOMY: SHX6048

## 2024-07-03 SURGERY — EXCISION, LESION, BREAST
Anesthesia: General | Site: Breast | Laterality: Right

## 2024-07-03 MED ORDER — LACTATED RINGERS IV SOLN
INTRAVENOUS | Status: DC
Start: 2024-07-03 — End: 2024-07-03

## 2024-07-03 MED ORDER — FENTANYL CITRATE (PF) 100 MCG/2ML IJ SOLN
50.0000 ug | Freq: Once | INTRAMUSCULAR | Status: AC
  Administered 2024-07-03: 50 ug via INTRAVENOUS

## 2024-07-03 MED ORDER — LACTATED RINGERS IV SOLN: INTRAVENOUS | Status: DC | PRN

## 2024-07-03 MED ORDER — DEXMEDETOMIDINE HCL IN NACL 80 MCG/20ML IV SOLN
INTRAVENOUS | Status: DC | PRN
  Administered 2024-07-03: 6 ug via INTRAVENOUS

## 2024-07-03 MED ORDER — PHENYLEPHRINE 80 MCG/ML (10ML) SYRINGE FOR IV PUSH (FOR BLOOD PRESSURE SUPPORT)
PREFILLED_SYRINGE | INTRAVENOUS | Status: DC | PRN
  Administered 2024-07-03: 40 ug via INTRAVENOUS

## 2024-07-03 MED ORDER — LIDOCAINE 2% (20 MG/ML) 5 ML SYRINGE
INTRAMUSCULAR | Status: DC | PRN
  Administered 2024-07-03: 20 mg via INTRAVENOUS

## 2024-07-03 MED ORDER — LIDOCAINE 2% (20 MG/ML) 5 ML SYRINGE
INTRAMUSCULAR | Status: AC
  Filled 2024-07-03: qty 5

## 2024-07-03 MED ORDER — PHENYLEPHRINE 80 MCG/ML (10ML) SYRINGE FOR IV PUSH (FOR BLOOD PRESSURE SUPPORT)
PREFILLED_SYRINGE | INTRAVENOUS | Status: AC
  Filled 2024-07-03: qty 10

## 2024-07-03 MED ORDER — ACETAMINOPHEN 500 MG PO TABS
ORAL_TABLET | ORAL | Status: AC
  Filled 2024-07-03: qty 2

## 2024-07-03 MED ORDER — FENTANYL CITRATE (PF) 100 MCG/2ML IJ SOLN
INTRAMUSCULAR | Status: AC
  Filled 2024-07-03: qty 2

## 2024-07-03 MED ORDER — ONDANSETRON HCL 4 MG/2ML IJ SOLN
INTRAMUSCULAR | Status: DC | PRN
  Administered 2024-07-03: 4 mg via INTRAVENOUS

## 2024-07-03 MED ORDER — TRAMADOL HCL 50 MG PO TABS
50.0000 mg | ORAL_TABLET | Freq: Four times a day (QID) | ORAL | 0 refills | Status: DC | PRN
  Filled 2024-07-03: qty 10, 3d supply, fill #0

## 2024-07-03 MED ORDER — CHLORHEXIDINE GLUCONATE CLOTH 2 % EX PADS: 6.0000 | MEDICATED_PAD | Freq: Once | CUTANEOUS | Status: DC

## 2024-07-03 MED ORDER — ACETAMINOPHEN 500 MG PO TABS
1000.0000 mg | ORAL_TABLET | Freq: Once | ORAL | Status: AC
  Administered 2024-07-03: 1000 mg via ORAL

## 2024-07-03 MED ORDER — OXYCODONE HCL 5 MG PO TABS: 5.0000 mg | ORAL_TABLET | Freq: Once | ORAL | Status: DC | PRN

## 2024-07-03 MED ORDER — MIDAZOLAM HCL 2 MG/2ML IJ SOLN
INTRAMUSCULAR | Status: AC
  Filled 2024-07-03: qty 2

## 2024-07-03 MED ORDER — BUPIVACAINE HCL (PF) 0.25 % IJ SOLN
INTRAMUSCULAR | Status: AC
Start: 2024-07-03 — End: 2024-07-03
  Filled 2024-07-03: qty 30

## 2024-07-03 MED ORDER — ONDANSETRON HCL 4 MG/2ML IJ SOLN
INTRAMUSCULAR | Status: AC
  Filled 2024-07-03: qty 2

## 2024-07-03 MED ORDER — CEFAZOLIN SODIUM-DEXTROSE 2-4 GM/100ML-% IV SOLN
INTRAVENOUS | Status: AC
  Filled 2024-07-03: qty 100

## 2024-07-03 MED ORDER — FENTANYL CITRATE (PF) 100 MCG/2ML IJ SOLN
INTRAMUSCULAR | Status: DC | PRN
  Administered 2024-07-03: 25 ug via INTRAVENOUS

## 2024-07-03 MED ORDER — MIDAZOLAM HCL 2 MG/2ML IJ SOLN: 0.5000 mg | Freq: Once | INTRAMUSCULAR | Status: DC | PRN

## 2024-07-03 MED ORDER — CHLORHEXIDINE GLUCONATE CLOTH 2 % EX PADS
6.0000 | MEDICATED_PAD | Freq: Once | CUTANEOUS | Status: DC
Start: 2024-07-03 — End: 2024-07-03

## 2024-07-03 MED ORDER — OXYCODONE HCL 5 MG/5ML PO SOLN: 5.0000 mg | Freq: Once | ORAL | Status: DC | PRN

## 2024-07-03 MED ORDER — DEXAMETHASONE SODIUM PHOSPHATE 10 MG/ML IJ SOLN
INTRAMUSCULAR | Status: AC
  Filled 2024-07-03: qty 1

## 2024-07-03 MED ORDER — BUPIVACAINE HCL (PF) 0.25 % IJ SOLN
INTRAMUSCULAR | Status: DC | PRN
  Administered 2024-07-03: 4 mL

## 2024-07-03 MED ORDER — TRAMADOL HCL 50 MG PO TABS
50.0000 mg | ORAL_TABLET | Freq: Four times a day (QID) | ORAL | 0 refills | Status: DC | PRN
  Filled 2024-07-03 (×2): qty 10, 3d supply, fill #0

## 2024-07-03 MED ORDER — HYDROMORPHONE HCL 1 MG/ML IJ SOLN: 0.2500 mg | INTRAMUSCULAR | Status: DC | PRN

## 2024-07-03 MED ORDER — BUPIVACAINE-EPINEPHRINE (PF) 0.5% -1:200000 IJ SOLN
INTRAMUSCULAR | Status: DC | PRN
  Administered 2024-07-03: 30 mL

## 2024-07-03 MED ORDER — MIDAZOLAM HCL 2 MG/2ML IJ SOLN
1.0000 mg | Freq: Once | INTRAMUSCULAR | Status: AC
  Administered 2024-07-03: 1 mg via INTRAVENOUS

## 2024-07-03 MED ORDER — PROPOFOL 10 MG/ML IV BOLUS
INTRAVENOUS | Status: DC | PRN
  Administered 2024-07-03: 120 mg via INTRAVENOUS

## 2024-07-03 MED ORDER — CEFAZOLIN SODIUM-DEXTROSE 2-4 GM/100ML-% IV SOLN
2.0000 g | INTRAVENOUS | Status: AC
  Administered 2024-07-03: 2 g via INTRAVENOUS

## 2024-07-03 MED ORDER — DEXAMETHASONE SODIUM PHOSPHATE 10 MG/ML IJ SOLN
INTRAMUSCULAR | Status: DC | PRN
  Administered 2024-07-03: 10 mg via INTRAVENOUS

## 2024-07-03 MED ORDER — ACETAMINOPHEN 500 MG PO TABS: 1000.0000 mg | ORAL_TABLET | ORAL | Status: AC

## 2024-07-03 SURGICAL SUPPLY — 45 items
BINDER BREAST LRG (GAUZE/BANDAGES/DRESSINGS) IMPLANT
BINDER BREAST MEDIUM (GAUZE/BANDAGES/DRESSINGS) IMPLANT
BINDER BREAST XLRG (GAUZE/BANDAGES/DRESSINGS) IMPLANT
BINDER BREAST XXLRG (GAUZE/BANDAGES/DRESSINGS) IMPLANT
BLADE SURG 15 STRL LF DISP TIS (BLADE) ×2 IMPLANT
CANISTER SUCT 1200ML W/VALVE (MISCELLANEOUS) ×2 IMPLANT
CHLORAPREP W/TINT 26 (MISCELLANEOUS) ×2 IMPLANT
CLIP APPLIE 11 MED OPEN (CLIP) IMPLANT
CLIP TI WIDE RED SMALL 6 (CLIP) IMPLANT
COVER BACK TABLE 60X90IN (DRAPES) ×2 IMPLANT
COVER MAYO STAND STRL (DRAPES) ×2 IMPLANT
DERMABOND ADVANCED .7 DNX12 (GAUZE/BANDAGES/DRESSINGS) IMPLANT
DRAPE LAPAROSCOPIC ABDOMINAL (DRAPES) ×2 IMPLANT
DRAPE UTILITY XL STRL (DRAPES) ×2 IMPLANT
DRSG TEGADERM 4X4.75 (GAUZE/BANDAGES/DRESSINGS) ×2 IMPLANT
ELECT COATED BLADE 2.86 ST (ELECTRODE) ×2 IMPLANT
ELECTRODE REM PT RTRN 9FT ADLT (ELECTROSURGICAL) ×2 IMPLANT
GAUZE SPONGE 4X4 12PLY STRL LF (GAUZE/BANDAGES/DRESSINGS) ×2 IMPLANT
GLOVE BIO SURGEON STRL SZ7 (GLOVE) ×2 IMPLANT
GLOVE BIOGEL PI IND STRL 7.5 (GLOVE) ×2 IMPLANT
GOWN STRL REUS W/ TWL LRG LVL3 (GOWN DISPOSABLE) ×6 IMPLANT
HEMOSTAT ARISTA ABSORB 3G PWDR (HEMOSTASIS) IMPLANT
KIT MARKER MARGIN INK (KITS) IMPLANT
NDL HYPO 25X1 1.5 SAFETY (NEEDLE) ×2 IMPLANT
NEEDLE HYPO 25X1 1.5 SAFETY (NEEDLE) ×2 IMPLANT
NS IRRIG 1000ML POUR BTL (IV SOLUTION) IMPLANT
PACK BASIN DAY SURGERY FS (CUSTOM PROCEDURE TRAY) ×2 IMPLANT
PENCIL SMOKE EVACUATOR (MISCELLANEOUS) ×2 IMPLANT
RETRACTOR ONETRAX LX 90X20 (MISCELLANEOUS) IMPLANT
SLEEVE SCD COMPRESS KNEE MED (STOCKING) ×2 IMPLANT
SPIKE FLUID TRANSFER (MISCELLANEOUS) IMPLANT
SPONGE T-LAP 4X18 ~~LOC~~+RFID (SPONGE) ×2 IMPLANT
STRIP CLOSURE SKIN 1/2X4 (GAUZE/BANDAGES/DRESSINGS) IMPLANT
SUT MNCRL AB 3-0 PS2 18 (SUTURE) IMPLANT
SUT MNCRL AB 4-0 PS2 18 (SUTURE) IMPLANT
SUT MON AB 5-0 PS2 18 (SUTURE) IMPLANT
SUT SILK 2 0 SH (SUTURE) IMPLANT
SUT VIC AB 2-0 SH 27XBRD (SUTURE) ×2 IMPLANT
SUT VIC AB 3-0 SH 27X BRD (SUTURE) ×2 IMPLANT
SUT VIC AB 5-0 PS2 18 (SUTURE) IMPLANT
SUT VICRYL AB 3 0 TIES (SUTURE) IMPLANT
SYR CONTROL 10ML LL (SYRINGE) ×2 IMPLANT
TOWEL GREEN STERILE FF (TOWEL DISPOSABLE) ×2 IMPLANT
TUBE CONNECTING 20X1/4 (TUBING) ×2 IMPLANT
YANKAUER SUCT BULB TIP NO VENT (SUCTIONS) ×2 IMPLANT

## 2024-07-03 NOTE — Anesthesia Procedure Notes (Signed)
 Anesthesia Regional Block: Pectoralis block   Pre-Anesthetic Checklist: , timeout performed,  Correct Patient, Correct Site, Correct Laterality,  Correct Procedure, Correct Position, site marked,  Risks and benefits discussed,  Surgical consent,  Pre-op evaluation,  At surgeon's request and post-op pain management  Laterality: Right  Prep: chloraprep       Needles:  Injection technique: Single-shot  Needle Type: Echogenic Needle     Needle Length: 9cm  Needle Gauge: 21     Additional Needles:   Procedures:,,,, ultrasound used (permanent image in chart),,    Narrative:  Start time: 07/03/2024 10:33 AM End time: 07/03/2024 10:39 AM Injection made incrementally with aspirations every 5 mL.  Performed by: Personally  Anesthesiologist: Leonce Athens, MD  Additional Notes: Pt identified in Holding room.  Monitors applied. Working IV access confirmed. Timeout, Sterile prep R clavicle and pec.  #21ga ECHOgenic Arrow block needle between pec minor and serratus, then between pec major and pec minor with US  guidance.  Total 30cc 0.5% Bupivacaine  1:200k epi  injected incrementally after negative test dose.  Patient asymptomatic, VSS, no heme aspirated, tolerated well.   JAYSON Leonce, MD

## 2024-07-03 NOTE — Anesthesia Procedure Notes (Signed)
 Procedure Name: LMA Insertion Date/Time: 07/03/2024 11:02 AM  Performed by: Denton Niels CROME, CRNAPre-anesthesia Checklist: Patient identified, Emergency Drugs available, Suction available, Patient being monitored and Timeout performed Patient Re-evaluated:Patient Re-evaluated prior to induction Oxygen Delivery Method: Circle system utilized Preoxygenation: Pre-oxygenation with 100% oxygen Induction Type: IV induction Ventilation: Mask ventilation without difficulty LMA: LMA inserted LMA Size: 4.0 Number of attempts: 1 Placement Confirmation: positive ETCO2 Dental Injury: Teeth and Oropharynx as per pre-operative assessment

## 2024-07-03 NOTE — Discharge Instructions (Addendum)
 [facility] Office Phone Number [phone]  POST OP INSTRUCTIONS Take 400 mg of ibuprofen every 8 hours or 650 mg tylenol  every 6 hours for next 72 hours then as needed. Use ice several times daily also.  A prescription for pain medication may be given to you upon discharge.  Take your pain medication as prescribed, if needed.  If narcotic pain medicine is not needed, then you may take acetaminophen  (Tylenol ), naprosyn (Alleve) or ibuprofen (Advil) as needed. Take your usually prescribed medications unless otherwise directed If you need a refill on your pain medication, please contact your pharmacy.  They will contact our office to request authorization.  Prescriptions will not be filled after 5pm or on week-ends. You should eat very light the first 24 hours after surgery, such as soup, crackers, pudding, etc.  Resume your normal diet the day after surgery. Most patients will experience some swelling and bruising in the breast.  Ice packs and a good support bra will help.  Wear the breast binder provided or a sports bra for 72 hours day and night.  After that wear a sports bra during the day until you return to the office. Swelling and bruising can take several days to resolve.  It is common to experience some constipation if taking pain medication after surgery.  Increasing fluid intake and taking a stool softener will usually help or prevent this problem from occurring.  A mild laxative (Milk of Magnesia or Miralax) should be taken according to package directions if there are no bowel movements after 48 hours. I used skin glue on the incision, you may shower in 24 hours.  The glue will flake off over the next 2-3 weeks as will the steristrips.   Any sutures or staples will be removed at the office during your follow-up visit. ACTIVITIES:  You may resume regular daily activities (gradually increasing) beginning the next day.  Wearing a good support bra or sports bra minimizes pain and  swelling.  You may have sexual intercourse when it is comfortable. You may drive when you no longer are taking prescription pain medication, you can comfortably wear a seatbelt, and you can safely maneuver your car and apply brakes. RETURN TO WORK:  ______________________________________________________________________________________ Rhonda Bell should see your doctor in the office for a follow-up appointment approximately two to three weeks after your surgery.  Your doctor's nurse will typically make your follow-up appointment when she calls you with your pathology report.  Expect your pathology report 3-4 business days after your surgery.  You may call to check if you do not hear from us  after three days.  WHEN TO CALL DR WAKEFIELD: Fever over 101.0 Nausea and/or vomiting. Extreme swelling or bruising. Continued bleeding from incision. Increased pain, redness, or drainage from the incision.  The clinic staff is available to answer your questions during regular business hours.  Please don't hesitate to call and ask to speak to one of the nurses for clinical concerns.  If you have a medical emergency, go to the nearest emergency room or call 911.  A surgeon from [facility] is always on call at the hospital.  For further questions, please visit [email or web address]  No Tylenol  until 4:15pm today, if needed.

## 2024-07-03 NOTE — Transfer of Care (Signed)
 Immediate Anesthesia Transfer of Care Note  Patient: Rhonda Bell  Procedure(s) Performed: RE-EXCISION RIGHT BREAST LUMPECTOMY (Right: Breast) BIOPSY, LYMPH NODE, SENTINEL, AXILLARY (Right)  Patient Location: PACU  Anesthesia Type:GA combined with regional for post-op pain  Level of Consciousness: drowsy, patient cooperative, and responds to stimulation  Airway & Oxygen Therapy: Patient Spontanous Breathing and Patient connected to face mask oxygen  Post-op Assessment: Report given to RN and Post -op Vital signs reviewed and stable  Post vital signs: Reviewed and stable  Last Vitals:  Vitals Value Taken Time  BP 176/89 07/03/24 12:06  Temp 36.2 C 07/03/24 12:06  Pulse 74 07/03/24 12:08  Resp 13 07/03/24 12:08  SpO2 100 % 07/03/24 12:08  Vitals shown include unfiled device data.  Last Pain:  Vitals:   07/03/24 1206  TempSrc:   PainSc: Asleep      Patients Stated Pain Goal: 3 (07/03/24 1009)  Complications: No notable events documented.

## 2024-07-03 NOTE — Anesthesia Preprocedure Evaluation (Addendum)
 Anesthesia Evaluation  Patient identified by MRN, date of birth, ID band Patient awake    Reviewed: Allergy & Precautions, NPO status , Patient's Chart, lab work & pertinent test results  History of Anesthesia Complications Negative for: history of anesthetic complications  Airway Mallampati: I  TM Distance: >3 FB Neck ROM: Full    Dental  (+) Dental Advisory Given, Missing   Pulmonary neg pulmonary ROS   breath sounds clear to auscultation       Cardiovascular hypertension, Pt. on medications + dysrhythmias Atrial Fibrillation  Rhythm:Regular Rate:Normal  '22 ECHO:  Mild left ventricular hypertrophy  The left ventricular size is normal.  LV ejection fraction = 50-55%.  Left ventricular systolic function is low normal.  The right ventricle is mildly dilated.  The left atrium is severely dilated.  The right atrium is moderately dilated.  There is mild mitral regurgitation.     Neuro/Psych negative neurological ROS     GI/Hepatic Neg liver ROS,GERD  Medicated and Controlled,,  Endo/Other  negative endocrine ROS    Renal/GU negative Renal ROS     Musculoskeletal  (+) Arthritis ,    Abdominal   Peds  Hematology negative hematology ROS (+) No blood thinner   Anesthesia Other Findings Breast cancer  Reproductive/Obstetrics                              Anesthesia Physical Anesthesia Plan  ASA: 3  Anesthesia Plan: General   Post-op Pain Management: Regional block* and Tylenol  PO (pre-op)*   Induction: Intravenous  PONV Risk Score and Plan: 3 and Ondansetron , Dexamethasone  and Treatment may vary due to age or medical condition  Airway Management Planned: LMA  Additional Equipment: None  Intra-op Plan:   Post-operative Plan:   Informed Consent: I have reviewed the patients History and Physical, chart, labs and discussed the procedure including the risks, benefits and  alternatives for the proposed anesthesia with the patient or authorized representative who has indicated his/her understanding and acceptance.     Dental advisory given  Plan Discussed with: CRNA and Surgeon  Anesthesia Plan Comments: (Plan routine monitors, GA with pectoralis block for post op analgesia)         Anesthesia Quick Evaluation

## 2024-07-03 NOTE — H&P (Signed)
 Rhonda Bell is an 78 y.o. female.   Chief Complaint: breast cancer HPI: 78 year old female who underwent a right breast radioactive seed guided lumpectomy. Her pathology ends up being extensive ductal carcinoma in situ that is high-grade. This totally measures 4.9 x 4.3 x 3.5 cm and is present in 5 of her 6 margins. I returned to the operating room and reexcised all of these margins. Her medial margin had additional focal residual DCIS and this margin is 8 mm clear now. The superior margin had atypical lobular hyperplasia and is clear. The anterior margin had additional DCIS and this is present in the new anterior margin. The posterior margin had additional DCIS and this margin is clear by 4 mm now. The lateral margin has 3 margins for DCIS as well as an invasive adenocarcinoma that was found. This is a grade 3. However it is 100% estrogen receptor positive, 90% progesterone receptor positive, negative for HER2 and the proliferation index is 1%. She has done well after surgery and is happy with her cosmetic result at this point. I do not think we need to proceed with the oncoplastic reduction and she is agreeable to that.   Past Medical History:  Diagnosis Date   Arthritis    Dysrhythmia    A-fib   GERD (gastroesophageal reflux disease)    Hypertension     Past Surgical History:  Procedure Laterality Date   BREAST BIOPSY  05/05/2024   MM RT RADIOACTIVE SEED LOC MAMMO GUIDE 05/05/2024 [facility] MAMMOGRAPHY   BREAST LUMPECTOMY Left    BREAST LUMPECTOMY Right 06/06/2024   Procedure: BREAST LUMPECTOMY, RE-EXCISION;  Surgeon: [name] Cough, MD;  Location: [facility] OR;  Service: General;  Laterality: Right;  RIGHT BREAST RE-EXCISION LUMPECTOMY   BREAST LUMPECTOMY WITH RADIOACTIVE SEED LOCALIZATION Right 05/08/2024   Procedure: BREAST LUMPECTOMY WITH RADIOACTIVE SEED LOCALIZATION;  Surgeon: [name] Cough, MD;  Location: [facility] OR;  Service: General;  Laterality: Right;  LMA RIGHT BREAST SEED GUIDED  LUMPECTOMY   THYROIDECTOMY, PARTIAL     TONSILLECTOMY      History reviewed. No pertinent family history. Social History:  reports that she has never smoked. She has never used smokeless tobacco. She reports that she does not drink alcohol and does not use drugs.  Allergies:  Allergies  Allergen Reactions   Lisinopril Cough   Penicillins Rash   Sulfa Antibiotics Rash    Medications Prior to Admission  Medication Sig Dispense Refill   aspirin EC 81 MG tablet Take 81 mg by mouth daily with lunch.     b complex vitamins capsule Take 1 capsule by mouth daily with lunch.     cetirizine (ZYRTEC) 10 MG tablet Take 10 mg by mouth in the morning.     hydrochlorothiazide  (HYDRODIURIL ) 12.5 MG tablet Take 1 tablet (12.5 mg total) by mouth daily. 90 tablet 3   meloxicam  (MOBIC ) 7.5 MG tablet Take 1 tablet (7.5 mg total) by mouth daily as needed for pain. 90 tablet 0   metoprolol  succinate (TOPROL -XL) 100 MG 24 hr tablet Take 1 tablet (100 mg total) by mouth daily. 90 tablet 0   Misc Natural Products (OSTEO BI-FLEX TRIPLE STRENGTH) TABS Take 1 tablet by mouth daily with lunch.     omeprazole  (PRILOSEC) 20 MG capsule Take 1 capsule (20 mg total) by mouth daily. 90 capsule 0   potassium chloride  SA (KLOR-CON  M) 20 MEQ tablet Take 1 tablet (20 mEq total) by mouth daily. 90 tablet 2   telmisartan  (MICARDIS ) 80 MG tablet  Take 1 tablet (80 mg total) by mouth daily. 90 tablet 1   Vitamin D-Vitamin K (D3 + K2 PO) Take 1 tablet by mouth daily with lunch.      No results found for this or any previous visit (from the past 48 hours). No results found.  Review of Systems  All other systems reviewed and are negative.   Blood pressure 137/80, pulse 67, temperature 98 F (36.7 C), temperature source Temporal, resp. rate 20, height 5' 5 (1.651 m), weight 70.6 kg, SpO2 96%. Physical Exam  Physical Examination:   Physical Exam   Incision is healing well without any evidence of infection or seroma, the  breast is symmetric to the other side  Assessment/Plan Re-excision right lumpectomy, right ax sn biopsy  We discussed all of her options today. I did discuss the invasive carcinoma and how that does change things. I am going to have her see medical oncology at this point.  We do need to return to the operating room. We discussed a mastectomy. I discussed that this area is bigger than was previously found on imaging so that is somewhat concerning but the mammogram did find an abnormality. We discussed that we may change the way we follow her but certainly not mandatory that she has a mastectomy now. I would need to go clear the anterior margin surgically at this point. She is agreeable to that and understands that she may need additional surgery depending on what we find.  We also discussed a sentinel lymph node biopsy. She is over 68 and this is mostly a favorable tumor however it is a grade 3 on pathology. I discussed a sentinel lymph node biopsy with the risks and benefits associated with that.    Donnice Bury, MD 07/03/2024, 10:17 AM

## 2024-07-03 NOTE — Anesthesia Postprocedure Evaluation (Signed)
 Anesthesia Post Note  Patient: Rhonda Bell  Procedure(s) Performed: RE-EXCISION RIGHT BREAST LUMPECTOMY (Right: Breast) BIOPSY, LYMPH NODE, SENTINEL, AXILLARY (Right)     Patient location during evaluation: PACU Anesthesia Type: General Level of consciousness: awake and alert, oriented and patient cooperative Pain management: pain level controlled Vital Signs Assessment: post-procedure vital signs reviewed and stable Respiratory status: spontaneous breathing, nonlabored ventilation and respiratory function stable Cardiovascular status: blood pressure returned to baseline and stable Postop Assessment: no apparent nausea or vomiting Anesthetic complications: no   No notable events documented.  Last Vitals:  Vitals:   07/03/24 1215 07/03/24 1230  BP: (!) 158/92 (!) 160/71  Pulse: 75 64  Resp: 13 15  Temp:    SpO2: 100% 100%    Last Pain:  Vitals:   07/03/24 1230  TempSrc:   PainSc: 0-No pain                 Kenyatte Chatmon,E. Irelynn Schermerhorn

## 2024-07-03 NOTE — Op Note (Signed)
 Preoperative diagnosis: Right breast dcis/idc with additional positive margin Postoperative diagnosis: Same as above Procedure: Right breast re-excision lumpectomy, right deep axillary sentinel node biopsy, injection of magtrace for sentinel node identification Surgeon: Dr. Adina Bury Anesthesia: General with pec block Estimated blood loss: Minimal Specimens: right breast anteromedial and anteriolateral margin marked short superior, long lateral and double deep Right deep axillary sentinel node with count of 112 Complications: None Drains: None Sponge needle count was correct completion Disposition recovery in stable condition   Indications: 79 year old female who underwent a screening mammogram at an outside facility.  She has heterogeneously dense breasts. She has an indeterminate asymmetry in the central posterior right breast. The left breast is stable. She underwent a biopsy with a ribbon clip placement. 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 She is here today to discuss her options. She had a very large hematoma after a biopsy that is slowly resolving. I did a lumpectomy with five positive margins. We then returned to OR and I excised all these margins.  The anterior is still positive and there was an invasive cancer noted as well.  Her oncotype is mildly elevated and she has elected to forgo chemotherapy.  I did discuss margin re-excision and a sentinel node biopsy .     Procedure: After informed consent was taken to the OR   She was given antibiotics.  SCDs were placed.  She was placed under general anesthesia without complication.  She was prepped and draped in standard sterile surgical fashion.  A surgical timeout was then performed.   I first injected 2 cc of magtrace in the uoq and massaged this for 5 minutes.  I made a crescent shaped incision to remove the old scar and the skin medial  I then  entered into the cavity.  I released all the old sutures. I then removed the anterior margin to the skin in two parts: anteromedial and anterolateral. This is skin now.   I then obtained hemostasis.  I closed the breast tissue with 2-0 Vicryl.  The skin was closed with 3-0 Vicryl and 4-0 Monocryl.  Glue and Steri-Strips were applied.    I made an incision below the axillary hairline and carried this to the axillary fascia.  I then entered the axilla proper.  She had 1 node noted upon entering.  This did have some activity in it.  There was no other real activity noted.  I remove this node as well as some of the surrounding tissue.  I then passed this off the table.  I then obtained hemostasis.  I closed the axillary fascia with 2-0 Vicryl.  The skin was closed with 3-0 Vicryl for Monocryl.  Glue insertions were applied.  She tolerated this well was extubated transferred to recovery stable.

## 2024-07-03 NOTE — Progress Notes (Signed)
Assisted Dr. Annye Asa with right, pectoralis, ultrasound guided block. Side rails up, monitors on throughout procedure. See vital signs in flow sheet. Tolerated Procedure well. ?

## 2024-07-04 ENCOUNTER — Encounter (HOSPITAL_BASED_OUTPATIENT_CLINIC_OR_DEPARTMENT_OTHER): Payer: Self-pay | Admitting: General Surgery

## 2024-07-10 ENCOUNTER — Other Ambulatory Visit: Payer: Self-pay

## 2024-07-10 ENCOUNTER — Other Ambulatory Visit (HOSPITAL_COMMUNITY): Payer: Self-pay

## 2024-07-10 LAB — SURGICAL PATHOLOGY

## 2024-07-10 MED ORDER — MELOXICAM 7.5 MG PO TABS
7.5000 mg | ORAL_TABLET | Freq: Every day | ORAL | 0 refills | Status: DC | PRN
  Filled 2024-07-24: qty 90, 90d supply, fill #0

## 2024-07-12 ENCOUNTER — Encounter: Payer: Self-pay | Admitting: *Deleted

## 2024-07-13 ENCOUNTER — Encounter: Payer: Self-pay | Admitting: Diagnostic Radiology

## 2024-07-19 ENCOUNTER — Inpatient Hospital Stay
Admission: RE | Admit: 2024-07-19 | Discharge: 2024-07-19 | Disposition: A | Discharge status: Home / Self Care | Payer: Self-pay | Source: Ambulatory Visit | Attending: Radiation Oncology | Admitting: Radiation Oncology

## 2024-07-19 ENCOUNTER — Other Ambulatory Visit: Payer: Self-pay | Admitting: Radiation Oncology

## 2024-07-19 ENCOUNTER — Inpatient Hospital Stay
Admission: RE | Admit: 2024-07-19 | Discharge: 2024-07-19 | Disposition: A | Payer: Self-pay | Source: Ambulatory Visit | Attending: Radiation Oncology | Admitting: Radiation Oncology

## 2024-07-19 DIAGNOSIS — C50911 Malignant neoplasm of unspecified site of right female breast: Secondary | ICD-10-CM

## 2024-07-24 ENCOUNTER — Other Ambulatory Visit: Payer: Self-pay

## 2024-07-24 ENCOUNTER — Other Ambulatory Visit (HOSPITAL_COMMUNITY): Payer: Self-pay

## 2024-07-24 MED ORDER — OMEPRAZOLE 20 MG PO CPDR
20.0000 mg | DELAYED_RELEASE_CAPSULE | Freq: Every day | ORAL | 0 refills | Status: DC
  Filled 2024-07-24: qty 90, 90d supply, fill #0

## 2024-07-25 ENCOUNTER — Other Ambulatory Visit: Payer: Self-pay

## 2024-07-26 ENCOUNTER — Inpatient Hospital Stay: Attending: Hematology and Oncology | Admitting: Hematology and Oncology

## 2024-07-26 ENCOUNTER — Other Ambulatory Visit: Payer: Self-pay

## 2024-07-26 VITALS — BP 149/92 | HR 84 | Temp 98.6°F | Resp 18 | Ht 65.0 in | Wt 156.8 lb

## 2024-07-26 DIAGNOSIS — Z170 Estrogen receptor positive status [ER+]: Secondary | ICD-10-CM | POA: Insufficient documentation

## 2024-07-26 DIAGNOSIS — Z803 Family history of malignant neoplasm of breast: Secondary | ICD-10-CM | POA: Diagnosis not present

## 2024-07-26 DIAGNOSIS — C50911 Malignant neoplasm of unspecified site of right female breast: Secondary | ICD-10-CM | POA: Insufficient documentation

## 2024-07-26 NOTE — Progress Notes (Signed)
 Location of Breast Cancer: Malignant Neoplasm of Right Breast, Estrogen Receptor Positive   Histology per Pathology Report:    Receptor Status: ER(100%Positive ), PR (Positive), Her2-neu (Negative), Ki-67(1%)  Did patient present with symptoms (if so, please note symptoms) or was this found on screening mammography?:  Mammogram   Past/Anticipated interventions by surgeon, if any: 07/03/2024 [name], MD Re-Excision Right Breast Lumpectomy  Right Axillary Sentinel    Past/Anticipated interventions by medical oncology, if any:  07/26/2024 Iruku, MD   Lymphedema issues, if any:  Yes, has some swelling under arm   Pain issues, if any:  None   SAFETY ISSUES: Prior radiation? None Pacemaker/ICD? None Possible current pregnancy?None Is the patient on methotrexate? None  Current Complaints / other details:  None

## 2024-07-26 NOTE — Progress Notes (Signed)
 [facility] CONSULT NOTE  Patient Care Team: Norleen Nohemi Shuck, MD as PCP - General (Internal Medicine) [name] Cough, MD as Consulting Physician (General Surgery) Gerome, Devere HERO, RN as Oncology Nurse Navigator Tyree Nanetta SAILOR, RN as Oncology Nurse Navigator Loretha Ash, MD as Consulting Physician (Hematology and Oncology) Izell Domino, MD as Attending Physician (Radiation Oncology)  CHIEF COMPLAINTS/PURPOSE OF CONSULTATION:  Newly diagnosed breast cancer  HISTORY OF PRESENTING ILLNESS:  Rhonda Bell 78 y.o. female is here because of recent diagnosis of right breast DCIS and IDC  I reviewed her records extensively and collaborated the history with the patient.  SUMMARY OF ONCOLOGIC HISTORY: Oncology History  Malignant neoplasm of right breast in female, estrogen receptor positive (HCC)  06/06/2024 Pathology Results   A. RIGHT BREAST, MEDIAL MARGIN, RE-EXCISION: Focal residual ductal carcinoma in situ, intermediate to high nuclear grade, micropapillary papillary and clinging type Negative for invasive carcinoma Margin free (DCIS 8 mm from new medial margin) Focal atypical lobular hyperplasia Multiple small complex sclerosing lesions and sclerosed/intraductal papillomas Fibrocystic changes including stromal fibrosis, cystic dilatation of ducts, sclerosing adenosis and usual duct hyperplasia Microcalcifications present within adenosis and benign ducts Changes consistent with prior procedure  B. RIGHT BREAST, SUPERIOR MARGIN, RE-EXCISION: Atypical lobular hyperplasia Complex sclerosing lesion Fibrocystic changes including stromal fibrosis, cystic dilatation of ducts and adenosis Microcalcifications present within benign ducts and adenosis Changes consistent with prior procedure Negative for carcinoma  C. RIGHT BREAST, LATERAL MARGIN, RE-EXCISION: Invasive poorly differentiated ductal adenocarcinoma, grade 3 (3+2+3) Ductal carcinoma in situ,  intermediate to high nuclear grade, micropapillary and clinging type Invasive tumor measures at least 0.8 cm in greatest dimension (pT1b) Margin free of carcinoma (invasive tumor 7 mm and DCIS 6 mm from new lateral margin) Negative for angiolymphatic invasion Focal atypical lobular hyperplasia Complex sclerosing lesion Fibrocystic changes including stromal fibrosis, cystic dilatation of ducts, adenosis and usual duct hyperplasia Microcalcifications present within adenosis and benign fibromatoid stroma Changes consistent with prior procedure  D. RIGHT BREAST, ANTERIOR MARGIN, RE-EXCISION: Ductal carcinoma in situ, intermediate to high nuclear grade, micropapillary and clinging type Negative for invasive carcinoma DCIS present in new anterior margin Changes consistent with prior biopsy Small sclerosed intraductal papilloma Focal fibrocystic changes including stromal fibrosis, cystic dilatation of ducts and adenosis Microcalcifications present within adenosis and papilloma  E. RIGHT BREAST, POSTERIOR MARGIN, RE-EXCISION: Ductal carcinoma in situ, intermediate to high nuclear grade, micropapillary and clinging types Negative for invasive carcinoma Margin free (DCIS 0.4 mm from the new posterior margin) Changes consistent with prior procedure Small complex sclerosing lesion, sclerosed intraductal papilloma and fibromatoid change Fibrocystic changes including stromal fibrosis, cystic dilatation of ducts, adenosis and usual duct hyperplasia Microcalcifications present within adenosis and fibromatoid stroma  ONCOLOGY TABLE:  INVASIVE CARCINOMA OF THE BREAST:  Resection    06/16/2024 Initial Diagnosis   Malignant neoplasm of right breast in female, estrogen receptor positive (HCC)   06/21/2024 Cancer Staging   Staging form: Breast, AJCC 8th Edition - Clinical: Stage IA (cT1, cN0, cM0, G3, ER+, PR+, HER2-) - Signed by Loretha Ash, MD on 06/21/2024 Histologic grading system: 3  grade system    Discussed the use of AI scribe software for clinical note transcription with the patient, who gave verbal consent to proceed.  History of Present Illness  Rhonda Bell is a 78 year old female with extensive ductal carcinoma in situ (DCIS) who presents for follow-up after multiple surgeries and to discuss further treatment options.  She has undergone three surgeries for extensive  ductal carcinoma in situ (DCIS) and a focus of IDC. During the last surgery, lymph nodes were removed for examination, and results were favorable. She experiences some swelling post-surgery, initially the size of a golf ball, which increased to the size of a tangerine. She is managing the swelling with exercises twice a day and is considering drainage if it becomes too large.  She reports a weeping incision from the last surgery, described as a clear, water-like stain. She is using a pad to manage the weeping and is monitoring for any signs of infection. She has a history of sensitivity to adhesives, which she manages by using paper tape.  She is still working, Scientist, research (medical) to Haematologist, and plans to continue working around her treatment schedule. She has a supportive work environment that allows flexibility with her time.  She has discussed the potential side effects of anti-estrogen medication, such as hot flashes, vaginal dryness, and bone density loss. She managed menopause well in the past, experiencing only changes in temperature regulation.  MEDICAL HISTORY:  Past Medical History:  Diagnosis Date   Arthritis    Dysrhythmia    A-fib   GERD (gastroesophageal reflux disease)    Hypertension     SURGICAL HISTORY: Past Surgical History:  Procedure Laterality Date   AXILLARY SENTINEL NODE BIOPSY Right 07/03/2024   Procedure: BIOPSY, LYMPH NODE, SENTINEL, AXILLARY;  Surgeon: [name] Cough, MD;  Location: [facility];  Service: General;  Laterality: Right;  RIGHT  AXILLARY SENTINEL NODE BIOPSY   BREAST BIOPSY  05/05/2024   MM RT RADIOACTIVE SEED LOC MAMMO GUIDE 05/05/2024 [facility] MAMMOGRAPHY   BREAST LUMPECTOMY Left    BREAST LUMPECTOMY Right 06/06/2024   Procedure: BREAST LUMPECTOMY, RE-EXCISION;  Surgeon: [name] Cough, MD;  Location: [facility] OR;  Service: General;  Laterality: Right;  RIGHT BREAST RE-EXCISION LUMPECTOMY   BREAST LUMPECTOMY WITH RADIOACTIVE SEED LOCALIZATION Right 05/08/2024   Procedure: BREAST LUMPECTOMY WITH RADIOACTIVE SEED LOCALIZATION;  Surgeon: [name] Cough, MD;  Location: [facility] OR;  Service: General;  Laterality: Right;  LMA RIGHT BREAST SEED GUIDED LUMPECTOMY   RE-EXCISION OF BREAST LUMPECTOMY Right 07/03/2024   Procedure: RE-EXCISION RIGHT BREAST LUMPECTOMY;  Surgeon: [name] Cough, MD;  Location: [facility];  Service: General;  Laterality: Right;  LMA w/PEC BLOCK RE-EXCISION RIGHT LUMPECTOMY   THYROIDECTOMY, PARTIAL     TONSILLECTOMY      SOCIAL HISTORY: Social History   Socioeconomic History   Marital status: Widowed    Spouse name: Not on file   Number of children: Not on file   Years of education: Not on file   Highest education level: Not on file  Occupational History   Not on file  Tobacco Use   Smoking status: Never   Smokeless tobacco: Never  Vaping Use   Vaping status: Not on file  Substance and Sexual Activity   Alcohol use: Never   Drug use: Never   Sexual activity: Not on file  Other Topics Concern   Not on file  Social History Narrative   Not on file   Social Drivers of Health   Financial Resource Strain: Not on file  Food Insecurity: No Food Insecurity (06/28/2024)   Hunger Vital Sign    Worried About Running Out of Food in the Last Year: Never true    Ran Out of Food in the Last Year: Never true  Transportation Needs: No Transportation Needs (06/28/2024)   PRAPARE - Administrator, Civil Service (Medical): No  Lack of Transportation (Non-Medical): No   Physical Activity: Not on file  Stress: Not on file  Social Connections: Not on file  Intimate Partner Violence: Not on file    FAMILY HISTORY: No family history on file.  ALLERGIES:  is allergic to lisinopril, penicillins, and sulfa antibiotics.  MEDICATIONS:  Current Outpatient Medications  Medication Sig Dispense Refill   aspirin EC 81 MG tablet Take 81 mg by mouth daily with lunch.     b complex vitamins capsule Take 1 capsule by mouth daily with lunch.     cetirizine (ZYRTEC) 10 MG tablet Take 10 mg by mouth in the morning.     hydrochlorothiazide  (HYDRODIURIL ) 12.5 MG tablet Take 1 tablet (12.5 mg total) by mouth daily. 90 tablet 3   meloxicam  (MOBIC ) 7.5 MG tablet Take 1 tablet (7.5 mg total) by mouth daily as needed for pain. 90 tablet 0   metoprolol  succinate (TOPROL -XL) 100 MG 24 hr tablet Take 1 tablet (100 mg total) by mouth daily. 90 tablet 0   Misc Natural Products (OSTEO BI-FLEX TRIPLE STRENGTH) TABS Take 1 tablet by mouth daily with lunch.     omeprazole  (PRILOSEC) 20 MG capsule Take 1 capsule (20 mg total) by mouth daily. 90 capsule 0   potassium chloride  SA (KLOR-CON  M) 20 MEQ tablet Take 1 tablet (20 mEq total) by mouth daily. 90 tablet 2   telmisartan  (MICARDIS ) 80 MG tablet Take 1 tablet (80 mg total) by mouth daily. 90 tablet 1   traMADol  (ULTRAM ) 50 MG tablet Take 1 tablet (50 mg total) by mouth every 6 (six) hours as needed. 10 tablet 0   traMADol  (ULTRAM ) 50 MG tablet Take 1 tablet (50 mg total) by mouth every 6 (six) hours as needed. 10 tablet 0   Vitamin D-Vitamin K (D3 + K2 PO) Take 1 tablet by mouth daily with lunch.     No current facility-administered medications for this visit.     All other systems were reviewed with the patient and are negative.  PHYSICAL EXAMINATION: ECOG PERFORMANCE STATUS: 0 - Asymptomatic  Vitals:   07/26/24 1153  BP: (!) 149/92  Pulse: 84  Resp: 18  Temp: 98.6 F (37 C)  SpO2: 100%    Filed Weights   07/26/24  1153  Weight: 156 lb 12.8 oz (71.1 kg)     GENERAL:alert, no distress and comfortable Right axillary seroma noted, large. She is otherwise wearing a compression bra and has a pad taped to her surgical incision.   LABORATORY DATA:  I have reviewed the data as listed Lab Results  Component Value Date   WBC 6.5 05/02/2024   HGB 12.8 05/02/2024   HCT 39.4 05/02/2024   MCV 91.8 05/02/2024   PLT 199 05/02/2024   Lab Results  Component Value Date   NA 140 06/30/2024   K 3.8 06/30/2024   CL 101 06/30/2024   CO2 28 06/30/2024    RADIOGRAPHIC STUDIES: I have personally reviewed the radiological reports and agreed with the findings in the report.  ASSESSMENT AND PLAN:  Assessment and Plan Assessment & Plan Invasive ductal carcinoma of right breast, ER/PR positive, HER2 negative, grade 3, with extensive ductal carcinoma in situ Invasive ductal carcinoma, 8 mm, ER/PR positive, HER2 negative, grade 3, with extensive DCIS. Invasive component at margin. Repeat surgery with DCIS involves the inked new ant margin, right axillary LN neg. Discussed with Dr [name], no role for further surgery Oncotype DX of 27, pt refused adj chemotherapy because of  the borderline score and wanted to focus on QOL. She is now here for follow up after re excision. Most recent path reviewed She doesn't need any more surgery per Dr [name].  - Proceed with radiation therapy after October 31st. - Initiate anti-estrogen therapy post-radiation, monitor for side effects. - Discussed tamoxifen vs aromatase inhibitors, mechanism of action, adverse effects with each class. - Discuss radiation schedule with Dr. Izell on October 14th and 15th. - Consult with Dr. [name] on October 13th for post op.  Postoperative seroma and wound drainage of breast Postoperative seroma increased to tangerine size.  - Continue exercises to manage seroma. - Monitor wound drainage for signs of infection. - Consider seroma  drainage if size persists or increases.  All questions were answered. The patient knows to call the clinic with any problems, questions or concerns.    Amber Stalls, MD 07/26/24

## 2024-07-31 NOTE — Progress Notes (Signed)
 Radiation Oncology         [phone] ________________________________  Name: Rhonda Bell MRN: [id number]  Date: 08/01/2024  DOB: 10-13-1946  Follow-Up Visit Note  Outpatient  CC: Norleen Nohemi Shuck, MD  Loretha Ash, MD  Diagnosis:   No diagnosis found. ***  High-grade right breast DCIS, ER+ / PR+ / Her2 negative  CHIEF COMPLAINT: Here to discuss management of right breast cancer  Narrative:  The patient returns today for follow-up. She was last seen in office on 05/16/24 for a consultation visit.    Since consultation date, she underwent a re-excision procedure with Dr. [name] on 06/06/24. Surgical pathology of right breast re-excision indicated focal residual, intermediate to high nuclear grade, micropapillary papillary and clinging type DCIS. Margins involved by DCIS, anterior margin. Distance from Closest Margin (mm): 0.4 mm from new posterior  margin, 6 mm from new lateral margin, 8 mm from new medial margin and at  least 16 mm from new superior margin. Pathology also indicated a small invasive ductal carcinoma measuring eight millimeters was discovered, not near the margins. Prognostic indicators significant for: estrogen receptor 100%, positive, strong staining intensity; progesterone receptor 90% positive, strong staining intensity; Proliferation marker Ki67 at 1%; Her2 status negative.   Oncotype DX was obtained on the final surgical sample and the recurrence score of 27 predicts a risk of recurrence outside the breast over the next 9 years of 16%, if the patient's only systemic therapy is an antiestrogen for 5 years.  It also predicts no significant benefit from chemotherapy.  She was seen in consultation with Dr. Loretha on 06/21/24 where they opted to proceed with re-excision then adjuvant endocrine therapy with aromatase inhibitor post-radiation. Given her Oncotype results, she declined chemotherapy.        Subsequently, she underwent re-excision and lymph node mapping  with Dr. [name] on 07/03/24. Anterior-medial margin showed residual DCIS with new margins negative for malignancy. Immunohistochemical stains for p63, calponin and SMM 1 do not show  evidence of invasive carcinoma.    During her post-op follow up on 07/31/24, she reported recovering well ***  Symptomatically, the patient reports: ***        ALLERGIES:  is allergic to lisinopril, penicillins, and sulfa antibiotics.  Meds: Current Outpatient Medications  Medication Sig Dispense Refill   aspirin EC 81 MG tablet Take 81 mg by mouth daily with lunch.     b complex vitamins capsule Take 1 capsule by mouth daily with lunch.     cetirizine (ZYRTEC) 10 MG tablet Take 10 mg by mouth in the morning.     hydrochlorothiazide  (HYDRODIURIL ) 12.5 MG tablet Take 1 tablet (12.5 mg total) by mouth daily. 90 tablet 3   meloxicam  (MOBIC ) 7.5 MG tablet Take 1 tablet (7.5 mg total) by mouth daily as needed for pain. 90 tablet 0   metoprolol  succinate (TOPROL -XL) 100 MG 24 hr tablet Take 1 tablet (100 mg total) by mouth daily. 90 tablet 0   Misc Natural Products (OSTEO BI-FLEX TRIPLE STRENGTH) TABS Take 1 tablet by mouth daily with lunch.     omeprazole  (PRILOSEC) 20 MG capsule Take 1 capsule (20 mg total) by mouth daily. 90 capsule 0   potassium chloride  SA (KLOR-CON  M) 20 MEQ tablet Take 1 tablet (20 mEq total) by mouth daily. 90 tablet 2   telmisartan  (MICARDIS ) 80 MG tablet Take 1 tablet (80 mg total) by mouth daily. 90 tablet 1   traMADol  (ULTRAM ) 50 MG tablet Take 1 tablet (50 mg total)  by mouth every 6 (six) hours as needed. 10 tablet 0   traMADol  (ULTRAM ) 50 MG tablet Take 1 tablet (50 mg total) by mouth every 6 (six) hours as needed. 10 tablet 0   Vitamin D-Vitamin K (D3 + K2 PO) Take 1 tablet by mouth daily with lunch.     No current facility-administered medications for this visit.    Physical Findings:  vitals were not taken for this visit. .     General: Alert and oriented, in no acute  distress HEENT: Head is normocephalic. Extraocular movements are intact. Oropharynx is clear. Neck: Neck is supple, no palpable cervical or supraclavicular lymphadenopathy. Heart: Regular in rate and rhythm with no murmurs, rubs, or gallops. Chest: Clear to auscultation bilaterally, with no rhonchi, wheezes, or rales. Abdomen: Soft, nontender, nondistended, with no rigidity or guarding. Extremities: No cyanosis or edema. Lymphatics: see Neck Exam Musculoskeletal: symmetric strength and muscle tone throughout. Neurologic: No obvious focalities. Speech is fluent.  Psychiatric: Judgment and insight are intact. Affect is appropriate. Breast exam reveals ***  Lab Findings: Lab Results  Component Value Date   WBC 6.5 05/02/2024   HGB 12.8 05/02/2024   HCT 39.4 05/02/2024   MCV 91.8 05/02/2024   PLT 199 05/02/2024    @LASTCHEMISTRY @  Radiographic Findings: MM Outside Films Mammo Result Date: 07/19/2024 This examination belongs to an outside facility and is stored here for comparison purposes only.  Contact the originating outside institution for any associated report or interpretation.  MM Outside Films Mammo Result Date: 07/19/2024 This examination belongs to an outside facility and is stored here for comparison purposes only.  Contact the originating outside institution for any associated report or interpretation.  MM Outside Films Mammo Result Date: 07/19/2024 This examination belongs to an outside facility and is stored here for comparison purposes only.  Contact the originating outside institution for any associated report or interpretation.  MM Outside Films Mammo Result Date: 07/19/2024 This examination belongs to an outside facility and is stored here for comparison purposes only.  Contact the originating outside institution for any associated report or interpretation.  MM Outside Films Mammo Result Date: 07/19/2024 This examination belongs to an outside facility and is stored  here for comparison purposes only.  Contact the originating outside institution for any associated report or interpretation.   Impression/Plan: We discussed adjuvant radiotherapy today.  I recommend *** in order to ***.  I reviewed the logistics, benefits, risks, and potential side effects of this treatment in detail. Risks may include but not necessary be limited to acute and late injury tissue in the radiation fields such as skin irritation (change in color/pigmentation, itching, dryness, pain, peeling). She may experience fatigue. We also discussed possible risk of long term cosmetic changes or scar tissue. There is also a smaller risk for lung toxicity, ***cardiac toxicity, ***brachial plexopathy, ***lymphedema, ***musculoskeletal changes, ***rib fragility or ***induction of a second malignancy, ***late chronic non-healing soft tissue wound.    The patient asked good questions which I answered to her satisfaction. She is enthusiastic about proceeding with treatment. A consent form has been *** signed and placed in her chart.  A total of *** medically necessary complex treatment devices will be fabricated and supervised by me: *** fields with MLCs for custom blocks to protect heart, and lungs;  and, a Vac-lok. MORE COMPLEX DEVICES MAY BE MADE IN DOSIMETRY FOR FIELD IN FIELD BEAMS FOR DOSE HOMOGENEITY.  I have requested : 3D Simulation which is medically necessary to give adequate  dose to at risk tissues while sparing lungs and heart.  I have requested a DVH of the following structures: lungs, heart, *** lumpectomy cavity.    The patient will receive *** Gy in *** fractions to the *** with *** fields.  This will be *** followed by a boost.  On date of service, in total, I spent *** minutes on this encounter. Patient was seen in person.  _____________________________________   Lauraine Golden, MD  This document serves as a record of services personally performed by Lauraine Golden, MD. It was created on  her behalf by Reymundo Cartwright, a trained medical scribe. The creation of this record is based on the scribe's personal observations and the provider's statements to them. This document has been checked and approved by the attending provider.

## 2024-08-01 ENCOUNTER — Encounter: Payer: Self-pay | Admitting: Radiation Oncology

## 2024-08-01 ENCOUNTER — Ambulatory Visit
Admission: RE | Admit: 2024-08-01 | Discharge: 2024-08-01 | Disposition: A | Discharge status: Home / Self Care | Source: Ambulatory Visit | Attending: Radiation Oncology | Admitting: Radiation Oncology

## 2024-08-01 DIAGNOSIS — C50111 Malignant neoplasm of central portion of right female breast: Secondary | ICD-10-CM

## 2024-08-01 DIAGNOSIS — D0511 Intraductal carcinoma in situ of right breast: Secondary | ICD-10-CM

## 2024-08-02 ENCOUNTER — Ambulatory Visit
Admission: RE | Admit: 2024-08-02 | Discharge: 2024-08-02 | Disposition: A | Discharge status: Home / Self Care | Source: Ambulatory Visit | Attending: Radiation Oncology | Admitting: Radiation Oncology

## 2024-08-02 ENCOUNTER — Other Ambulatory Visit (HOSPITAL_COMMUNITY): Payer: Self-pay

## 2024-08-02 DIAGNOSIS — C50111 Malignant neoplasm of central portion of right female breast: Secondary | ICD-10-CM | POA: Insufficient documentation

## 2024-08-02 DIAGNOSIS — Z510 Encounter for antineoplastic radiation therapy: Secondary | ICD-10-CM | POA: Insufficient documentation

## 2024-08-08 ENCOUNTER — Encounter: Payer: Self-pay | Admitting: *Deleted

## 2024-08-08 DIAGNOSIS — C50911 Malignant neoplasm of unspecified site of right female breast: Secondary | ICD-10-CM

## 2024-08-15 ENCOUNTER — Other Ambulatory Visit: Payer: Self-pay

## 2024-08-15 DIAGNOSIS — Z510 Encounter for antineoplastic radiation therapy: Secondary | ICD-10-CM | POA: Diagnosis not present

## 2024-08-17 ENCOUNTER — Ambulatory Visit
Admission: RE | Admit: 2024-08-17 | Discharge: 2024-08-17 | Disposition: A | Discharge status: Home / Self Care | Source: Ambulatory Visit | Attending: Radiation Oncology | Admitting: Radiation Oncology

## 2024-08-17 ENCOUNTER — Other Ambulatory Visit: Payer: Self-pay

## 2024-08-17 DIAGNOSIS — Z510 Encounter for antineoplastic radiation therapy: Secondary | ICD-10-CM | POA: Diagnosis not present

## 2024-08-17 LAB — RAD ONC ARIA SESSION SUMMARY
Course Elapsed Days: 0
Plan Fractions Treated to Date: 1
Plan Prescribed Dose Per Fraction: 2.66 Gy
Plan Total Fractions Prescribed: 16
Plan Total Prescribed Dose: 42.56 Gy
Reference Point Dosage Given to Date: 2.66 Gy
Reference Point Session Dosage Given: 2.66 Gy
Session Number: 1

## 2024-08-18 ENCOUNTER — Other Ambulatory Visit: Payer: Self-pay

## 2024-08-18 ENCOUNTER — Ambulatory Visit
Admission: RE | Admit: 2024-08-18 | Discharge: 2024-08-18 | Disposition: A | Discharge status: Home / Self Care | Source: Ambulatory Visit | Attending: Radiation Oncology | Admitting: Radiation Oncology

## 2024-08-18 DIAGNOSIS — Z510 Encounter for antineoplastic radiation therapy: Secondary | ICD-10-CM | POA: Diagnosis not present

## 2024-08-18 LAB — RAD ONC ARIA SESSION SUMMARY
Course Elapsed Days: 1
Plan Fractions Treated to Date: 2
Plan Prescribed Dose Per Fraction: 2.66 Gy
Plan Total Fractions Prescribed: 16
Plan Total Prescribed Dose: 42.56 Gy
Reference Point Dosage Given to Date: 5.32 Gy
Reference Point Session Dosage Given: 2.66 Gy
Session Number: 2

## 2024-08-19 ENCOUNTER — Other Ambulatory Visit (HOSPITAL_COMMUNITY): Payer: Self-pay

## 2024-08-21 ENCOUNTER — Other Ambulatory Visit: Payer: Self-pay

## 2024-08-21 ENCOUNTER — Other Ambulatory Visit (HOSPITAL_COMMUNITY): Payer: Self-pay

## 2024-08-21 ENCOUNTER — Ambulatory Visit
Admission: RE | Admit: 2024-08-21 | Discharge: 2024-08-21 | Disposition: A | Source: Ambulatory Visit | Attending: Radiation Oncology

## 2024-08-21 ENCOUNTER — Ambulatory Visit
Admission: RE | Admit: 2024-08-21 | Discharge: 2024-08-21 | Disposition: A | Discharge status: Home / Self Care | Source: Ambulatory Visit | Attending: Radiation Oncology | Admitting: Radiation Oncology

## 2024-08-21 DIAGNOSIS — Z510 Encounter for antineoplastic radiation therapy: Secondary | ICD-10-CM | POA: Diagnosis present

## 2024-08-21 DIAGNOSIS — C50111 Malignant neoplasm of central portion of right female breast: Secondary | ICD-10-CM | POA: Diagnosis present

## 2024-08-21 LAB — RAD ONC ARIA SESSION SUMMARY
Course Elapsed Days: 4
Plan Fractions Treated to Date: 3
Plan Prescribed Dose Per Fraction: 2.66 Gy
Plan Total Fractions Prescribed: 16
Plan Total Prescribed Dose: 42.56 Gy
Reference Point Dosage Given to Date: 7.98 Gy
Reference Point Session Dosage Given: 2.66 Gy
Session Number: 3

## 2024-08-21 MED ORDER — METOPROLOL SUCCINATE ER 100 MG PO TB24
100.0000 mg | ORAL_TABLET | Freq: Every day | ORAL | 0 refills | Status: DC
  Filled 2024-08-21: qty 90, 90d supply, fill #0

## 2024-08-22 ENCOUNTER — Other Ambulatory Visit: Payer: Self-pay

## 2024-08-22 ENCOUNTER — Ambulatory Visit
Admission: RE | Admit: 2024-08-22 | Discharge: 2024-08-22 | Disposition: A | Discharge status: Home / Self Care | Source: Ambulatory Visit | Attending: Radiation Oncology | Admitting: Radiation Oncology

## 2024-08-22 DIAGNOSIS — Z510 Encounter for antineoplastic radiation therapy: Secondary | ICD-10-CM | POA: Diagnosis not present

## 2024-08-22 LAB — RAD ONC ARIA SESSION SUMMARY
Course Elapsed Days: 5
Plan Fractions Treated to Date: 4
Plan Prescribed Dose Per Fraction: 2.66 Gy
Plan Total Fractions Prescribed: 16
Plan Total Prescribed Dose: 42.56 Gy
Reference Point Dosage Given to Date: 10.64 Gy
Reference Point Session Dosage Given: 2.66 Gy
Session Number: 4

## 2024-08-23 ENCOUNTER — Ambulatory Visit
Admission: RE | Admit: 2024-08-23 | Discharge: 2024-08-23 | Disposition: A | Source: Ambulatory Visit | Attending: Radiation Oncology

## 2024-08-23 ENCOUNTER — Other Ambulatory Visit: Payer: Self-pay

## 2024-08-23 DIAGNOSIS — Z510 Encounter for antineoplastic radiation therapy: Secondary | ICD-10-CM | POA: Diagnosis not present

## 2024-08-23 LAB — RAD ONC ARIA SESSION SUMMARY
Course Elapsed Days: 6
Plan Fractions Treated to Date: 5
Plan Prescribed Dose Per Fraction: 2.66 Gy
Plan Total Fractions Prescribed: 16
Plan Total Prescribed Dose: 42.56 Gy
Reference Point Dosage Given to Date: 13.3 Gy
Reference Point Session Dosage Given: 2.66 Gy
Session Number: 5

## 2024-08-24 ENCOUNTER — Ambulatory Visit
Admission: RE | Admit: 2024-08-24 | Discharge: 2024-08-24 | Disposition: A | Discharge status: Home / Self Care | Source: Ambulatory Visit | Attending: Radiation Oncology | Admitting: Radiation Oncology

## 2024-08-24 ENCOUNTER — Other Ambulatory Visit: Payer: Self-pay

## 2024-08-24 DIAGNOSIS — Z510 Encounter for antineoplastic radiation therapy: Secondary | ICD-10-CM | POA: Diagnosis not present

## 2024-08-24 LAB — RAD ONC ARIA SESSION SUMMARY
Course Elapsed Days: 7
Plan Fractions Treated to Date: 6
Plan Prescribed Dose Per Fraction: 2.66 Gy
Plan Total Fractions Prescribed: 16
Plan Total Prescribed Dose: 42.56 Gy
Reference Point Dosage Given to Date: 15.96 Gy
Reference Point Session Dosage Given: 2.66 Gy
Session Number: 6

## 2024-08-25 ENCOUNTER — Ambulatory Visit
Admission: RE | Admit: 2024-08-25 | Discharge: 2024-08-25 | Disposition: A | Discharge status: Home / Self Care | Source: Ambulatory Visit | Attending: Radiation Oncology | Admitting: Radiation Oncology

## 2024-08-25 ENCOUNTER — Other Ambulatory Visit: Payer: Self-pay

## 2024-08-25 DIAGNOSIS — Z510 Encounter for antineoplastic radiation therapy: Secondary | ICD-10-CM | POA: Diagnosis not present

## 2024-08-25 LAB — RAD ONC ARIA SESSION SUMMARY
Course Elapsed Days: 8
Plan Fractions Treated to Date: 7
Plan Prescribed Dose Per Fraction: 2.66 Gy
Plan Total Fractions Prescribed: 16
Plan Total Prescribed Dose: 42.56 Gy
Reference Point Dosage Given to Date: 18.62 Gy
Reference Point Session Dosage Given: 2.66 Gy
Session Number: 7

## 2024-08-26 ENCOUNTER — Other Ambulatory Visit (HOSPITAL_COMMUNITY): Payer: Self-pay

## 2024-08-28 ENCOUNTER — Other Ambulatory Visit: Payer: Self-pay

## 2024-08-28 ENCOUNTER — Ambulatory Visit
Admission: RE | Admit: 2024-08-28 | Discharge: 2024-08-28 | Disposition: A | Discharge status: Home / Self Care | Source: Ambulatory Visit | Attending: Radiation Oncology | Admitting: Radiation Oncology

## 2024-08-28 ENCOUNTER — Other Ambulatory Visit (HOSPITAL_COMMUNITY): Payer: Self-pay

## 2024-08-28 DIAGNOSIS — Z510 Encounter for antineoplastic radiation therapy: Secondary | ICD-10-CM | POA: Diagnosis not present

## 2024-08-28 LAB — RAD ONC ARIA SESSION SUMMARY
Course Elapsed Days: 11
Plan Fractions Treated to Date: 8
Plan Prescribed Dose Per Fraction: 2.66 Gy
Plan Total Fractions Prescribed: 16
Plan Total Prescribed Dose: 42.56 Gy
Reference Point Dosage Given to Date: 21.28 Gy
Reference Point Session Dosage Given: 2.66 Gy
Session Number: 8

## 2024-08-28 MED ORDER — POTASSIUM CHLORIDE CRYS ER 20 MEQ PO TBCR
20.0000 meq | EXTENDED_RELEASE_TABLET | Freq: Every day | ORAL | 2 refills | Status: AC
  Filled 2024-08-28: qty 90, 90d supply, fill #0
  Filled 2024-11-21: qty 90, 90d supply, fill #1

## 2024-08-29 ENCOUNTER — Other Ambulatory Visit: Payer: Self-pay

## 2024-08-29 ENCOUNTER — Ambulatory Visit
Admission: RE | Admit: 2024-08-29 | Discharge: 2024-08-29 | Disposition: A | Discharge status: Home / Self Care | Source: Ambulatory Visit | Attending: Radiation Oncology | Admitting: Radiation Oncology

## 2024-08-29 ENCOUNTER — Telehealth: Payer: Self-pay | Admitting: Adult Health

## 2024-08-29 DIAGNOSIS — Z510 Encounter for antineoplastic radiation therapy: Secondary | ICD-10-CM | POA: Diagnosis not present

## 2024-08-29 LAB — RAD ONC ARIA SESSION SUMMARY
Course Elapsed Days: 12
Plan Fractions Treated to Date: 9
Plan Prescribed Dose Per Fraction: 2.66 Gy
Plan Total Fractions Prescribed: 16
Plan Total Prescribed Dose: 42.56 Gy
Reference Point Dosage Given to Date: 23.94 Gy
Reference Point Session Dosage Given: 2.66 Gy
Session Number: 9

## 2024-08-29 NOTE — Telephone Encounter (Signed)
 Called the patient comfirm a time and dates with patinet.

## 2024-08-30 ENCOUNTER — Ambulatory Visit
Admission: RE | Admit: 2024-08-30 | Discharge: 2024-08-30 | Disposition: A | Discharge status: Home / Self Care | Source: Ambulatory Visit | Attending: Radiation Oncology | Admitting: Radiation Oncology

## 2024-08-30 ENCOUNTER — Other Ambulatory Visit: Payer: Self-pay

## 2024-08-30 DIAGNOSIS — Z510 Encounter for antineoplastic radiation therapy: Secondary | ICD-10-CM | POA: Diagnosis not present

## 2024-08-30 LAB — RAD ONC ARIA SESSION SUMMARY
Course Elapsed Days: 13
Plan Fractions Treated to Date: 10
Plan Prescribed Dose Per Fraction: 2.66 Gy
Plan Total Fractions Prescribed: 16
Plan Total Prescribed Dose: 42.56 Gy
Reference Point Dosage Given to Date: 26.6 Gy
Reference Point Session Dosage Given: 2.66 Gy
Session Number: 10

## 2024-08-31 ENCOUNTER — Other Ambulatory Visit: Payer: Self-pay

## 2024-08-31 ENCOUNTER — Ambulatory Visit
Admission: RE | Admit: 2024-08-31 | Discharge: 2024-08-31 | Disposition: A | Source: Ambulatory Visit | Attending: Radiation Oncology

## 2024-08-31 DIAGNOSIS — Z510 Encounter for antineoplastic radiation therapy: Secondary | ICD-10-CM | POA: Diagnosis not present

## 2024-08-31 LAB — RAD ONC ARIA SESSION SUMMARY
Course Elapsed Days: 14
Plan Fractions Treated to Date: 11
Plan Prescribed Dose Per Fraction: 2.66 Gy
Plan Total Fractions Prescribed: 16
Plan Total Prescribed Dose: 42.56 Gy
Reference Point Dosage Given to Date: 29.26 Gy
Reference Point Session Dosage Given: 2.66 Gy
Session Number: 11

## 2024-09-01 ENCOUNTER — Other Ambulatory Visit: Payer: Self-pay

## 2024-09-01 ENCOUNTER — Ambulatory Visit
Admission: RE | Admit: 2024-09-01 | Discharge: 2024-09-01 | Disposition: A | Discharge status: Home / Self Care | Source: Ambulatory Visit | Attending: Radiation Oncology | Admitting: Radiation Oncology

## 2024-09-01 DIAGNOSIS — Z510 Encounter for antineoplastic radiation therapy: Secondary | ICD-10-CM | POA: Diagnosis not present

## 2024-09-01 LAB — RAD ONC ARIA SESSION SUMMARY
Course Elapsed Days: 15
Plan Fractions Treated to Date: 12
Plan Prescribed Dose Per Fraction: 2.66 Gy
Plan Total Fractions Prescribed: 16
Plan Total Prescribed Dose: 42.56 Gy
Reference Point Dosage Given to Date: 31.92 Gy
Reference Point Session Dosage Given: 2.66 Gy
Session Number: 12

## 2024-09-04 ENCOUNTER — Other Ambulatory Visit: Payer: Self-pay

## 2024-09-04 ENCOUNTER — Ambulatory Visit
Admission: RE | Admit: 2024-09-04 | Discharge: 2024-09-04 | Disposition: A | Source: Ambulatory Visit | Attending: Radiation Oncology

## 2024-09-04 DIAGNOSIS — Z510 Encounter for antineoplastic radiation therapy: Secondary | ICD-10-CM | POA: Diagnosis not present

## 2024-09-04 LAB — RAD ONC ARIA SESSION SUMMARY
Course Elapsed Days: 18
Plan Fractions Treated to Date: 13
Plan Prescribed Dose Per Fraction: 2.66 Gy
Plan Total Fractions Prescribed: 16
Plan Total Prescribed Dose: 42.56 Gy
Reference Point Dosage Given to Date: 34.58 Gy
Reference Point Session Dosage Given: 2.66 Gy
Session Number: 13

## 2024-09-05 ENCOUNTER — Ambulatory Visit
Admission: RE | Admit: 2024-09-05 | Discharge: 2024-09-05 | Disposition: A | Source: Ambulatory Visit | Attending: Radiation Oncology

## 2024-09-05 ENCOUNTER — Other Ambulatory Visit: Payer: Self-pay

## 2024-09-05 DIAGNOSIS — Z510 Encounter for antineoplastic radiation therapy: Secondary | ICD-10-CM | POA: Diagnosis not present

## 2024-09-05 LAB — RAD ONC ARIA SESSION SUMMARY
Course Elapsed Days: 19
Plan Fractions Treated to Date: 14
Plan Prescribed Dose Per Fraction: 2.66 Gy
Plan Total Fractions Prescribed: 16
Plan Total Prescribed Dose: 42.56 Gy
Reference Point Dosage Given to Date: 37.24 Gy
Reference Point Session Dosage Given: 2.66 Gy
Session Number: 14

## 2024-09-06 ENCOUNTER — Other Ambulatory Visit: Payer: Self-pay

## 2024-09-06 ENCOUNTER — Ambulatory Visit
Admission: RE | Admit: 2024-09-06 | Discharge: 2024-09-06 | Disposition: A | Source: Ambulatory Visit | Attending: Radiation Oncology

## 2024-09-06 DIAGNOSIS — Z510 Encounter for antineoplastic radiation therapy: Secondary | ICD-10-CM | POA: Diagnosis not present

## 2024-09-06 LAB — RAD ONC ARIA SESSION SUMMARY
Course Elapsed Days: 20
Plan Fractions Treated to Date: 15
Plan Prescribed Dose Per Fraction: 2.66 Gy
Plan Total Fractions Prescribed: 16
Plan Total Prescribed Dose: 42.56 Gy
Reference Point Dosage Given to Date: 39.9 Gy
Reference Point Session Dosage Given: 2.66 Gy
Session Number: 15

## 2024-09-07 ENCOUNTER — Ambulatory Visit
Admission: RE | Admit: 2024-09-07 | Discharge: 2024-09-07 | Disposition: A | Discharge status: Home / Self Care | Source: Ambulatory Visit | Attending: Radiation Oncology | Admitting: Radiation Oncology

## 2024-09-07 ENCOUNTER — Other Ambulatory Visit: Payer: Self-pay

## 2024-09-07 DIAGNOSIS — Z510 Encounter for antineoplastic radiation therapy: Secondary | ICD-10-CM | POA: Diagnosis not present

## 2024-09-07 LAB — RAD ONC ARIA SESSION SUMMARY
Course Elapsed Days: 21
Plan Fractions Treated to Date: 16
Plan Prescribed Dose Per Fraction: 2.66 Gy
Plan Total Fractions Prescribed: 16
Plan Total Prescribed Dose: 42.56 Gy
Reference Point Dosage Given to Date: 42.56 Gy
Reference Point Session Dosage Given: 2.66 Gy
Session Number: 16

## 2024-09-08 NOTE — Radiation Completion Notes (Signed)
 Patient Name: Rhonda Bell, Rhonda Bell MRN: [id number] Date of Birth: 1945/11/12 Referring Physician: AMBER STALLS, M.D. Date of Service: 2024-09-08 Radiation Oncologist: Lauraine Golden, M.D. [facility] [place]                             RADIATION ONCOLOGY END OF TREATMENT NOTE     Diagnosis: C50.111 Malignant neoplasm of central portion of right female breast Staging on 2024-06-21: Malignant neoplasm of right breast in female, estrogen receptor positive (HCC) T=cT1, N=cN0, M=cM0 Intent: Curative     ==========DELIVERED PLANS==========  First Treatment Date: 2024-08-17 Last Treatment Date: 2024-09-07   Plan Name: Breast_R Site: Breast, Right Technique: 3D Mode: Photon Dose Per Fraction: 2.66 Gy Prescribed Dose (Delivered / Prescribed): 42.56 Gy / 42.56 Gy Prescribed Fxs (Delivered / Prescribed): 16 / 16     ==========ON TREATMENT VISIT DATES========== 2024-08-21, 2024-08-28, 2024-09-04     ==========UPCOMING VISITS========== 10/05/2024 [facility]-RADIATION ONC FOLLOW UP 20 Golden Lauraine, MD  10/03/2024 [facility]-MED ONCOLOGY EST PT 15 Stalls Amber, MD        ==========APPENDIX - ON TREATMENT VISIT NOTES==========   See weekly On Treatment Notes in Epic for details in the Media tab (listed as Progress notes on the On Treatment Visit Dates listed above).

## 2024-09-27 NOTE — Progress Notes (Incomplete)
 Rhonda Bell is here today for follow up post radiation to the breast.   Breast Side:***   They completed their radiation on: ***   Does the patient complain of any of the following: Post radiation skin issues: *** Breast Tenderness: *** Breast Swelling: *** Lymphadema: *** Range of Motion limitations: *** Fatigue post radiation: *** Appetite good/fair/poor: ***  Additional comments if applicable:

## 2024-10-02 ENCOUNTER — Telehealth: Payer: Self-pay

## 2024-10-02 NOTE — Telephone Encounter (Signed)
 Spoke with patient and confirmed appointment on 12/16

## 2024-10-03 ENCOUNTER — Inpatient Hospital Stay: Attending: Hematology and Oncology | Admitting: Hematology and Oncology

## 2024-10-03 ENCOUNTER — Other Ambulatory Visit (HOSPITAL_COMMUNITY): Payer: Self-pay

## 2024-10-03 VITALS — BP 137/62 | HR 68 | Temp 97.8°F | Resp 17 | Wt 155.8 lb

## 2024-10-03 DIAGNOSIS — C50911 Malignant neoplasm of unspecified site of right female breast: Secondary | ICD-10-CM

## 2024-10-03 DIAGNOSIS — Z803 Family history of malignant neoplasm of breast: Secondary | ICD-10-CM | POA: Insufficient documentation

## 2024-10-03 DIAGNOSIS — Z79810 Long term (current) use of selective estrogen receptor modulators (SERMs): Secondary | ICD-10-CM | POA: Diagnosis not present

## 2024-10-03 DIAGNOSIS — Z923 Personal history of irradiation: Secondary | ICD-10-CM | POA: Diagnosis not present

## 2024-10-03 DIAGNOSIS — Z170 Estrogen receptor positive status [ER+]: Secondary | ICD-10-CM | POA: Insufficient documentation

## 2024-10-03 MED ORDER — TAMOXIFEN CITRATE 20 MG PO TABS
20.0000 mg | ORAL_TABLET | Freq: Every day | ORAL | 2 refills | Status: AC
  Filled 2024-10-03: qty 90, 90d supply, fill #0

## 2024-10-03 NOTE — Progress Notes (Signed)
 [facility] CONSULT NOTE  Patient Care Team: Norleen Nohemi Shuck, MD as PCP - General (Internal Medicine) [name] Cough, MD as Consulting Physician (General Surgery) Gerome, Devere HERO, RN as Oncology Nurse Navigator Tyree Nanetta SAILOR, RN as Oncology Nurse Navigator Loretha Ash, MD as Consulting Physician (Hematology and Oncology) Izell Domino, MD as Attending Physician (Radiation Oncology)  CHIEF COMPLAINTS/PURPOSE OF CONSULTATION:  Newly diagnosed breast cancer  HISTORY OF PRESENTING ILLNESS:  Rhonda Bell 78 y.o. female is here because of recent diagnosis of right breast DCIS and IDC  I reviewed her records extensively and collaborated the history with the patient.  SUMMARY OF ONCOLOGIC HISTORY: Oncology History  Malignant neoplasm of right breast in female, estrogen receptor positive (HCC)  06/06/2024 Pathology Results   A. RIGHT BREAST, MEDIAL MARGIN, RE-EXCISION: Focal residual ductal carcinoma in situ, intermediate to high nuclear grade, micropapillary papillary and clinging type Negative for invasive carcinoma Margin free (DCIS 8 mm from new medial margin) Focal atypical lobular hyperplasia Multiple small complex sclerosing lesions and sclerosed/intraductal papillomas Fibrocystic changes including stromal fibrosis, cystic dilatation of ducts, sclerosing adenosis and usual duct hyperplasia Microcalcifications present within adenosis and benign ducts Changes consistent with prior procedure  B. RIGHT BREAST, SUPERIOR MARGIN, RE-EXCISION: Atypical lobular hyperplasia Complex sclerosing lesion Fibrocystic changes including stromal fibrosis, cystic dilatation of ducts and adenosis Microcalcifications present within benign ducts and adenosis Changes consistent with prior procedure Negative for carcinoma  C. RIGHT BREAST, LATERAL MARGIN, RE-EXCISION: Invasive poorly differentiated ductal adenocarcinoma, grade 3 (3+2+3) Ductal carcinoma in situ,  intermediate to high nuclear grade, micropapillary and clinging type Invasive tumor measures at least 0.8 cm in greatest dimension (pT1b) Margin free of carcinoma (invasive tumor 7 mm and DCIS 6 mm from new lateral margin) Negative for angiolymphatic invasion Focal atypical lobular hyperplasia Complex sclerosing lesion Fibrocystic changes including stromal fibrosis, cystic dilatation of ducts, adenosis and usual duct hyperplasia Microcalcifications present within adenosis and benign fibromatoid stroma Changes consistent with prior procedure  D. RIGHT BREAST, ANTERIOR MARGIN, RE-EXCISION: Ductal carcinoma in situ, intermediate to high nuclear grade, micropapillary and clinging type Negative for invasive carcinoma DCIS present in new anterior margin Changes consistent with prior biopsy Small sclerosed intraductal papilloma Focal fibrocystic changes including stromal fibrosis, cystic dilatation of ducts and adenosis Microcalcifications present within adenosis and papilloma  E. RIGHT BREAST, POSTERIOR MARGIN, RE-EXCISION: Ductal carcinoma in situ, intermediate to high nuclear grade, micropapillary and clinging types Negative for invasive carcinoma Margin free (DCIS 0.4 mm from the new posterior margin) Changes consistent with prior procedure Small complex sclerosing lesion, sclerosed intraductal papilloma and fibromatoid change Fibrocystic changes including stromal fibrosis, cystic dilatation of ducts, adenosis and usual duct hyperplasia Microcalcifications present within adenosis and fibromatoid stroma  ONCOLOGY TABLE:  INVASIVE CARCINOMA OF THE BREAST:  Resection    06/16/2024 Initial Diagnosis   Malignant neoplasm of right breast in female, estrogen receptor positive (HCC)   06/21/2024 Cancer Staging   Staging form: Breast, AJCC 8th Edition - Clinical: Stage IA (cT1, cN0, cM0, G3, ER+, PR+, HER2-) - Signed by Loretha Ash, MD on 06/21/2024 Histologic grading system: 3  grade system    Discussed the use of AI scribe software for clinical note transcription with the patient, who gave verbal consent to proceed.  History of Present Illness  Rhonda Bell is a 78 year old female with extensive estrogen receptor-positive ductal carcinoma in situ of the breast who presents for oncology follow-up and initiation of adjuvant endocrine therapy.  She recently completed radiation therapy for extensive  DCIS following three breast surgeries. She tolerated radiation well, reporting no significant adverse effects aside from mild fatigue on one day, which resolved with rest. She continues to use prescribed lotion for post-radiation skin care and reports no discomfort. She denies persistent pain, new breast masses, abnormal bleeding, or bruising. She remains physically active and has not experienced any days of feeling unwell since completing radiation.  She lost approximately thirty pounds prior to her surgeries by modifying her diet, primarily by skipping lunch and consuming three small protein bars daily. She reports improved well-being with weight loss and is adjusting to changes in clothing. She experiences occasional brief sharp pain in her right wrist and infrequent nocturnal foot cramps, which she manages with magnesium glycinate and massage. She denies persistent musculoskeletal symptoms.  She is considering initiation of tamoxifen  for adjuvant endocrine therapy and participated in a detailed discussion regarding its mechanism, duration, and potential adverse effects, including rare risks of thromboembolism and endometrial cancer.  Rest of the pertinent 10 point ROS reviewed and neg.  MEDICAL HISTORY:  Past Medical History:  Diagnosis Date   Arthritis    Dysrhythmia    A-fib   GERD (gastroesophageal reflux disease)    Hypertension     SURGICAL HISTORY: Past Surgical History:  Procedure Laterality Date   AXILLARY SENTINEL NODE BIOPSY Right 07/03/2024   Procedure:  BIOPSY, LYMPH NODE, SENTINEL, AXILLARY;  Surgeon: [name] Cough, MD;  Location: [facility];  Service: General;  Laterality: Right;  RIGHT AXILLARY SENTINEL NODE BIOPSY   BREAST BIOPSY  05/05/2024   MM RT RADIOACTIVE SEED LOC MAMMO GUIDE 05/05/2024 [facility] MAMMOGRAPHY   BREAST LUMPECTOMY Left    BREAST LUMPECTOMY Right 06/06/2024   Procedure: BREAST LUMPECTOMY, RE-EXCISION;  Surgeon: [name] Cough, MD;  Location: [facility] OR;  Service: General;  Laterality: Right;  RIGHT BREAST RE-EXCISION LUMPECTOMY   BREAST LUMPECTOMY WITH RADIOACTIVE SEED LOCALIZATION Right 05/08/2024   Procedure: BREAST LUMPECTOMY WITH RADIOACTIVE SEED LOCALIZATION;  Surgeon: [name] Cough, MD;  Location: [facility] OR;  Service: General;  Laterality: Right;  LMA RIGHT BREAST SEED GUIDED LUMPECTOMY   RE-EXCISION OF BREAST LUMPECTOMY Right 07/03/2024   Procedure: RE-EXCISION RIGHT BREAST LUMPECTOMY;  Surgeon: [name] Cough, MD;  Location: [facility];  Service: General;  Laterality: Right;  LMA w/PEC BLOCK RE-EXCISION RIGHT LUMPECTOMY   THYROIDECTOMY, PARTIAL     TONSILLECTOMY      SOCIAL HISTORY: Social History   Socioeconomic History   Marital status: Widowed    Spouse name: Not on file   Number of children: Not on file   Years of education: Not on file   Highest education level: Not on file  Occupational History   Not on file  Tobacco Use   Smoking status: Never   Smokeless tobacco: Never  Vaping Use   Vaping status: Not on file  Substance and Sexual Activity   Alcohol use: Never   Drug use: Never   Sexual activity: Not on file  Other Topics Concern   Not on file  Social History Narrative   Not on file   Social Drivers of Health   Tobacco Use: Low Risk  (08/04/2024)   Received from [facility]   Patient History    Smoking Tobacco Use: Never    Smokeless Tobacco Use: Never    Passive Exposure: Not on file  Financial Resource Strain: Not on file  Food  Insecurity: No Food Insecurity (08/01/2024)   Epic    Worried About Running Out  of Food in the Last Year: Never true    Ran Out of Food in the Last Year: Never true  Transportation Needs: No Transportation Needs (08/01/2024)   Epic    Lack of Transportation (Medical): No    Lack of Transportation (Non-Medical): No  Physical Activity: Not on file  Stress: Not on file  Social Connections: Not on file  Intimate Partner Violence: Not At Risk (08/01/2024)   Epic    Fear of Current or Ex-Partner: No    Emotionally Abused: No    Physically Abused: No    Sexually Abused: No  Depression (PHQ2-9): Low Risk (08/01/2024)   Depression (PHQ2-9)    PHQ-2 Score: 0  Alcohol Screen: Not on file  Housing: Low Risk (08/01/2024)   Epic    Unable to Pay for Housing in the Last Year: No    Number of Times Moved in the Last Year: 0    Homeless in the Last Year: No  Utilities: Not At Risk (08/01/2024)   Epic    Threatened with loss of utilities: No  Health Literacy: Not on file    FAMILY HISTORY: Family History  Problem Relation Age of Onset   Breast cancer Mother 55   Breast cancer Sister 51    ALLERGIES:  is allergic to lisinopril, penicillins, and sulfa antibiotics.  MEDICATIONS:  Current Outpatient Medications  Medication Sig Dispense Refill   aspirin EC 81 MG tablet Take 81 mg by mouth daily with lunch.     b complex vitamins capsule Take 1 capsule by mouth daily with lunch.     cetirizine (ZYRTEC) 10 MG tablet Take 10 mg by mouth in the morning.     hydrochlorothiazide  (HYDRODIURIL ) 12.5 MG tablet Take 1 tablet (12.5 mg total) by mouth daily. 90 tablet 3   meloxicam  (MOBIC ) 7.5 MG tablet Take 1 tablet (7.5 mg total) by mouth daily as needed for pain. 90 tablet 0   metoprolol  succinate (TOPROL -XL) 100 MG 24 hr tablet Take 1 tablet (100 mg total) by mouth daily. 90 tablet 0   Misc Natural Products (OSTEO BI-FLEX TRIPLE STRENGTH) TABS Take 1 tablet by mouth daily with lunch.      omeprazole  (PRILOSEC) 20 MG capsule Take 1 capsule (20 mg total) by mouth daily. 90 capsule 0   potassium chloride  SA (KLOR-CON  M) 20 MEQ tablet Take 1 tablet (20 mEq total) by mouth daily. 90 tablet 2   telmisartan  (MICARDIS ) 80 MG tablet Take 1 tablet (80 mg total) by mouth daily. 90 tablet 1   Vitamin D-Vitamin K (D3 + K2 PO) Take 1 tablet by mouth daily with lunch.     No current facility-administered medications for this visit.     All other systems were reviewed with the patient and are negative.  PHYSICAL EXAMINATION: ECOG PERFORMANCE STATUS: 0 - Asymptomatic  Vitals:   10/03/24 1341  BP: 137/62  Pulse: 68  Resp: 17  Temp: 97.8 F (36.6 C)  SpO2: 97%    Filed Weights   10/03/24 1341  Weight: 155 lb 12.8 oz (70.7 kg)     GENERAL:alert, no distress and comfortable   LABORATORY DATA:  I have reviewed the data as listed Lab Results  Component Value Date   WBC 6.5 05/02/2024   HGB 12.8 05/02/2024   HCT 39.4 05/02/2024   MCV 91.8 05/02/2024   PLT 199 05/02/2024   Lab Results  Component Value Date   NA 140 06/30/2024   K 3.8 06/30/2024   CL  101 06/30/2024   CO2 28 06/30/2024    RADIOGRAPHIC STUDIES: I have personally reviewed the radiological reports and agreed with the findings in the report.  ASSESSMENT AND PLAN:  Assessment and Plan Assessment & Plan Invasive ductal carcinoma of right breast, ER/PR positive, HER2 negative, grade 3, with extensive ductal carcinoma in situ Invasive ductal carcinoma, 8 mm, ER/PR positive, HER2 negative, grade 3, with extensive DCIS. Invasive component at margin. Repeat surgery with DCIS involves the inked new ant margin, right axillary LN neg. Discussed with Dr [name], no role for further surgery Oncotype DX of 27, pt refused adj chemotherapy because of the borderline score and wanted to focus on QOL. She is now here for follow up after re excision.  She completed adj radiation 09/07/2024.  - Discussed five years of  adjuvant endocrine therapy evidence. - Reviewed tamoxifen  vs. aromatase inhibitors: mechanisms, side effects, bone density impact. - Recommended tamoxifen  for side effect profile and bone density benefit. - Discussed tamoxifen  risks: 1-2% thromboembolism risk, rare endometrial carcinoma risk; advised reporting vaginal bleeding or unilateral leg swelling/pain, tenderness. - Ordered 90-day tamoxifen  supply from [facility] pharmacy. - Planned follow-up in three months for tamoxifen  tolerance assessment;  - Provided anticipatory guidance on side effects and urgent evaluation indications.  All questions were answered. The patient knows to call the clinic with any problems, questions or concerns.    Amber Stalls, MD 10/03/2024

## 2024-10-05 ENCOUNTER — Ambulatory Visit
Admission: RE | Admit: 2024-10-05 | Discharge: 2024-10-05 | Attending: Radiation Oncology | Admitting: Radiation Oncology

## 2024-10-05 DIAGNOSIS — C50111 Malignant neoplasm of central portion of right female breast: Secondary | ICD-10-CM

## 2024-10-08 ENCOUNTER — Other Ambulatory Visit: Payer: Self-pay | Admitting: Cardiology

## 2024-10-08 DIAGNOSIS — Z01810 Encounter for preprocedural cardiovascular examination: Secondary | ICD-10-CM

## 2024-10-08 DIAGNOSIS — I4891 Unspecified atrial fibrillation: Secondary | ICD-10-CM

## 2024-10-09 ENCOUNTER — Other Ambulatory Visit: Payer: Self-pay

## 2024-10-09 ENCOUNTER — Other Ambulatory Visit (HOSPITAL_COMMUNITY): Payer: Self-pay

## 2024-10-09 MED ORDER — TELMISARTAN 80 MG PO TABS
80.0000 mg | ORAL_TABLET | Freq: Every day | ORAL | 1 refills | Status: AC
  Filled 2024-10-09: qty 90, 90d supply, fill #0

## 2024-10-16 ENCOUNTER — Other Ambulatory Visit: Payer: Self-pay

## 2024-10-16 ENCOUNTER — Other Ambulatory Visit (HOSPITAL_COMMUNITY): Payer: Self-pay

## 2024-10-16 ENCOUNTER — Other Ambulatory Visit (HOSPITAL_BASED_OUTPATIENT_CLINIC_OR_DEPARTMENT_OTHER): Payer: Self-pay

## 2024-10-16 MED ORDER — MELOXICAM 7.5 MG PO TABS
7.5000 mg | ORAL_TABLET | Freq: Every evening | ORAL | 0 refills | Status: DC | PRN
  Filled 2024-10-16: qty 30, 30d supply, fill #0

## 2024-10-21 ENCOUNTER — Other Ambulatory Visit (HOSPITAL_COMMUNITY): Payer: Self-pay

## 2024-10-22 ENCOUNTER — Other Ambulatory Visit (HOSPITAL_COMMUNITY): Payer: Self-pay

## 2024-10-23 ENCOUNTER — Other Ambulatory Visit (HOSPITAL_COMMUNITY): Payer: Self-pay

## 2024-10-23 MED ORDER — OMEPRAZOLE 20 MG PO CPDR
20.0000 mg | DELAYED_RELEASE_CAPSULE | Freq: Every day | ORAL | 0 refills | Status: AC
  Filled 2024-10-23: qty 90, 90d supply, fill #0

## 2024-10-23 MED ORDER — HYDROCHLOROTHIAZIDE 12.5 MG PO TABS
12.5000 mg | ORAL_TABLET | Freq: Every day | ORAL | 3 refills | Status: AC
  Filled 2024-10-23 – 2024-10-25 (×2): qty 90, 90d supply, fill #0

## 2024-10-24 ENCOUNTER — Other Ambulatory Visit (HOSPITAL_COMMUNITY): Payer: Self-pay

## 2024-10-24 ENCOUNTER — Other Ambulatory Visit: Payer: Self-pay

## 2024-10-25 ENCOUNTER — Other Ambulatory Visit: Payer: Self-pay

## 2024-11-06 ENCOUNTER — Other Ambulatory Visit: Payer: Self-pay

## 2024-11-09 ENCOUNTER — Other Ambulatory Visit (HOSPITAL_COMMUNITY): Payer: Self-pay

## 2024-11-09 MED ORDER — MELOXICAM 7.5 MG PO TABS: 7.5000 mg | ORAL_TABLET | Freq: Every evening | ORAL | 1 refills | Status: AC | PRN

## 2024-11-17 ENCOUNTER — Other Ambulatory Visit (HOSPITAL_COMMUNITY): Payer: Self-pay

## 2024-11-17 ENCOUNTER — Other Ambulatory Visit: Payer: Self-pay

## 2024-11-17 MED ORDER — METOPROLOL SUCCINATE ER 100 MG PO TB24
100.0000 mg | ORAL_TABLET | Freq: Every day | ORAL | 0 refills | Status: AC
  Filled 2024-11-17: qty 90, 90d supply, fill #0

## 2024-12-06 ENCOUNTER — Ambulatory Visit: Admitting: Cardiology

## 2024-12-26 ENCOUNTER — Inpatient Hospital Stay

## 2024-12-26 ENCOUNTER — Inpatient Hospital Stay: Attending: Hematology and Oncology | Admitting: Adult Health

## 2025-01-08 ENCOUNTER — Inpatient Hospital Stay: Admitting: Adult Health
